# Patient Record
Sex: Female | Born: 1985 | Race: White | Hispanic: No | Marital: Married | Smoking: Never smoker
Health system: Southern US, Community
[De-identification: ages and names within clinical notes are randomized; demographics above are authoritative.]

## PROBLEM LIST (undated history)

## (undated) DIAGNOSIS — K8050 Calculus of bile duct without cholangitis or cholecystitis without obstruction: Principal | ICD-10-CM

## (undated) DIAGNOSIS — Z9889 Other specified postprocedural states: Secondary | ICD-10-CM

## (undated) DIAGNOSIS — G43909 Migraine, unspecified, not intractable, without status migrainosus: Secondary | ICD-10-CM

## (undated) HISTORY — DX: Migraine, unspecified, not intractable, without status migrainosus: G43.909

---

## 2006-09-21 ENCOUNTER — Emergency Department: Payer: Self-pay | Admitting: Emergency Medicine

## 2007-10-02 ENCOUNTER — Ambulatory Visit: Payer: Self-pay | Admitting: Internal Medicine

## 2009-11-15 ENCOUNTER — Observation Stay: Payer: Self-pay | Admitting: Obstetrics and Gynecology

## 2009-11-22 ENCOUNTER — Inpatient Hospital Stay: Payer: Self-pay

## 2011-07-09 ENCOUNTER — Ambulatory Visit: Payer: Self-pay | Admitting: Obstetrics and Gynecology

## 2011-07-10 ENCOUNTER — Inpatient Hospital Stay: Payer: Self-pay | Admitting: Obstetrics and Gynecology

## 2012-04-04 ENCOUNTER — Encounter: Payer: Self-pay | Admitting: Maternal & Fetal Medicine

## 2012-07-12 ENCOUNTER — Observation Stay: Payer: Self-pay

## 2012-07-12 LAB — CBC WITH DIFFERENTIAL/PLATELET
Basophil %: 0.5 %
Eosinophil #: 0.1 10*3/uL (ref 0.0–0.7)
Eosinophil %: 0.8 %
HGB: 13.1 g/dL (ref 12.0–16.0)
MCH: 32.2 pg (ref 26.0–34.0)
MCV: 92 fL (ref 80–100)
Monocyte #: 0.4 x10 3/mm (ref 0.2–0.9)
Monocyte %: 3.5 %
Neutrophil #: 8.8 10*3/uL — ABNORMAL HIGH (ref 1.4–6.5)
Neutrophil %: 79.8 %
Platelet: 248 10*3/uL (ref 150–440)
RBC: 4.08 10*6/uL (ref 3.80–5.20)
RDW: 13.4 % (ref 11.5–14.5)
WBC: 11 10*3/uL (ref 3.6–11.0)

## 2012-07-13 ENCOUNTER — Ambulatory Visit: Payer: Self-pay

## 2012-07-13 HISTORY — PX: TUBAL LIGATION: SHX77

## 2012-07-23 ENCOUNTER — Observation Stay: Payer: Self-pay

## 2012-09-13 ENCOUNTER — Observation Stay: Payer: Self-pay | Admitting: Obstetrics and Gynecology

## 2012-09-30 ENCOUNTER — Observation Stay: Payer: Self-pay | Admitting: Obstetrics and Gynecology

## 2012-10-07 ENCOUNTER — Ambulatory Visit: Payer: Self-pay | Admitting: Obstetrics and Gynecology

## 2012-10-07 LAB — CBC WITH DIFFERENTIAL/PLATELET
Basophil #: 0 10*3/uL (ref 0.0–0.1)
HCT: 37.3 % (ref 35.0–47.0)
HGB: 12.7 g/dL (ref 12.0–16.0)
Lymphocyte %: 13.4 %
MCH: 30.6 pg (ref 26.0–34.0)
MCHC: 34.2 g/dL (ref 32.0–36.0)
MCV: 90 fL (ref 80–100)
Monocyte #: 0.3 x10 3/mm (ref 0.2–0.9)
Neutrophil #: 7.3 10*3/uL — ABNORMAL HIGH (ref 1.4–6.5)
Platelet: 213 10*3/uL (ref 150–440)
RDW: 13.8 % (ref 11.5–14.5)
WBC: 8.9 10*3/uL (ref 3.6–11.0)

## 2012-10-10 ENCOUNTER — Inpatient Hospital Stay: Payer: Self-pay | Admitting: Obstetrics and Gynecology

## 2014-07-09 ENCOUNTER — Ambulatory Visit: Payer: Self-pay | Admitting: Physician Assistant

## 2014-11-02 NOTE — Discharge Summary (Signed)
PATIENT NAME:  Rhonda Gillespie, Rhonda Gillespie MR#:  [id number] DATE OF BIRTH:  Nov 22, 1985  DATE OF ADMISSION:  10/10/2012 DATE OF DISCHARGE:  10/13/2012  ADMISSION DIAGNOSES: 1.  Term gestation.  2.  History of cesarean section.  3.  Desired repeat cesarean section.  4.  Desired tubal ligation.   DISCHARGE DIAGNOSES: 1.  Term gestation.  2.  History of cesarean section.  3.  Desired repeat cesarean section.  4.  Desired tubal ligation.   PROCEDURE:  Repeat cesarean section and tubal ligation. Please see operative note.   HOSPITAL COURSE: The patient did well postoperatively. She was afebrile and tolerating diet from day 1. She was discharged home with routine prescriptions, precautions and followup.  ____________________________ Reatha Harpsicky Gillespie. Logan BoresEvans, MD rle:sb D: 10/18/2012 09:20:30 ET T: 10/18/2012 09:43:01 ET JOB#: [id number]  cc: Ricky Gillespie. Logan BoresEvans, MD, <Dictator> Augustina MoodICK Gillespie Sartaj Hoskin MD ELECTRONICALLY SIGNED 10/24/2012 11:34

## 2014-11-02 NOTE — Op Note (Signed)
PATIENT NAME:  Rhonda Gillespie, [name] MR#:  [id number] DATE OF BIRTH:  Apr 26, 1986  DATE OF PROCEDURE:  10/10/2012  PREOPERATIVE DIAGNOSES:  1. Term gestation.  2. History of previous cesarean section.  3. Desires repeat. 4. Desires tubal ligation.   POSTOPERATIVE DIAGNOSES:  1. Term gestation.  2. History of previous cesarean section.  3. Desires repeat. 4. Desires tubal ligation.   PROCEDURE: Repeat low transverse cesarean section and bilateral tubal ligation.   SURGEON: Ricky L. Logan BoresEvans, MD   ASSISTANT: Milon Scorearon Jones, certified nurse midwife.  ANESTHESIA: Spinal by Dr. Darleene CleaverVan Staveren.   FINDINGS: Postsurgical abdomen, uterus adherent left fallopian tube, vigorous infant female, Apgars 9 and 9.   ESTIMATED BLOOD LOSS: 750.   COMPLICATIONS: None.   SPECIMENS: Left fallopian tube fimbria and portion of right fallopian tube ampullary segment.   PROCEDURE IN DETAIL: The patient was consented. Preoperative antibiotics were given. Taken to the operating room and placed in sitting position where spinal was placed then placed in supine position. Prepped and draped in the usual sterile fashion. Foley catheter inserted. After assuring adequate anesthesia, a #10 blade was used to create a Pfannenstiel incision through old scar. Dense scar tissue was dissected through. Bladder was dissected off the uterus. A low transverse incision was made. Amniotic sac was bluntly entered and extended, and head of infant was delivered with fundal pressure. Placenta was manually delivered. Uterus was exteriorized. Bladder blade was placed. Uterine cavity curettaged and then closed with a running interlocking 0 chromic from left to right. A moist lap was placed. We turned our attention to the fallopian tubes.   Right fallopian tube was visualized and grasped in a relatively avascular region in the ampullary segment and portion of fallopian tube was removed after double ligation with 0 plain in the usual fashion. Stumps were  cauterized and on inspection, the left fallopian tube showed adherence densely to the left ovary down its course. Fimbria was clear, so fimbria was elevated, clamped with Tresa EndoKelly, excised and double sutured with 0 Vicryl. Stump was made hemostatic.   Uterus returned to the abdominal cavity. Incision sites were seen to be hemostatic x3. The rectus was hemostatic. The fascia was closed left to right with 0 Vicryl after insertion of On-Q pump catheters. Subcu was made hemostatic with cautery and the skin was reapproximated with 0 gut suture x 3 vertical mattress. The skin incision was closed with absorbable sutures and then the On-Q catheters were secured using skin glue, Steri-Strips, and Tegaderm in the usual fashion. Catheters were then bolused with 5 mL each.  The patient tolerated the procedure well. Preoperative antibiotics were given and was 800 of clindamycin. Anticipate a routine postoperative course.     ____________________________ Reatha Harpsicky L. Logan BoresEvans, MD rle:es D: 10/10/2012 08:43:02 ET T: 10/10/2012 09:10:14 ET JOB#: [id number]  cc: Ricky L. Logan BoresEvans, MD, <Dictator> Augustina MoodICK L Refugio Mcconico MD ELECTRONICALLY SIGNED 10/10/2012 12:15

## 2014-11-04 NOTE — Discharge Summary (Signed)
PATIENT NAME:  Rhonda Gillespie, Brigitt L MR#:  [id number] DATE OF BIRTH:  1985/07/30  DATE OF ADMISSION:  07/10/2011 DATE OF DISCHARGE:  07/13/2011  ADMISSION DIAGNOSES:  1. Term gestation.  2. Prior cesarean.  3. Desires repeat cesarean section.   DISCHARGE DIAGNOSES:  1. Term gestation.  2. Prior cesarean.  3. Desires repeat cesarean section.   PROCEDURE: Repeat cesarean.  CONSULTANTS: Anesthesia for surgery.   COMPLICATIONS: None.   HOSPITAL COURSE: The patient did well. Postoperative hematocrit 39.5. She was afebrile throughout and on the morning of day threes she was discharged home with routine prescriptions, precautions, and follow-up.  ____________________________ Reatha Harpsicky L. Logan BoresEvans, MD rle:slb D: 07/20/2011 22:00:33 ET T: 07/21/2011 16:57:54 ET JOB#: [id number]  cc: Ricky L. Logan BoresEvans, MD, <Dictator> Augustina MoodICK L Camille Dragan MD ELECTRONICALLY SIGNED 07/22/2011 9:24

## 2014-11-20 NOTE — H&P (Signed)
L&D Evaluation:  History:   HPI [id number] with LMP of 01/11/12 & EDD of 10/17/12 with PNC at [facility] significant for CC:vag bleeding today at 1100 approx 10 cm's bright red on peri pad. No UC's, decreased FM or any S/S of labor. Pt has hx of marginal previa and has been on pelvic rest. Currently, not having sex. No LOF, or further bleeding seen.    Presents with vaginal bleeding    Patient's Medical History Obesity, Close interconceptual spacing, SIDS hx at 6 mos, APre-ecclampsia    Patient's Surgical History LTCS    Medications Pre Natal Vitamins    Allergies PCN, Amoxicillen, Pitocin causes itching    Social History none    Family History Non-Contributory   ROS:   ROS All systems were reviewed.  HEENT, CNS, GI, GU, Respiratory, CV, Renal and Musculoskeletal systems were found to be normal.   Exam:   Vital Signs stable    General no apparent distress    Mental Status clear    Chest clear    Heart normal sinus rhythm, no murmur/gallop/rubs    Abdomen gravid, non-tender    Estimated Fetal Weight Average for gestational age    Back no CVAT    Edema 1+    Reflexes 1+    Clonus negative    Pelvic not evaluated    Mebranes Intact    FHT normal rate with no decels, + FHR and very active FM    Ucx other    Skin dry    Lymph no lymphadenopathy   Impression:   Impression IUP at 26 weeks with Vag Bleeding   Plan:   Plan Obs for vag bleeding and VS    Comments Pt and family and partner informed of marginal placenta and probably the reason she is bleeding. Will continue to observe. CBC ordered. Will order US.   Electronic Signatures: Sharee PimpleJones, Ashli Selders W (CNM)  (Signed 31-Dec-13 13:01)  Authored: L&D Evaluation   Last Updated: 31-Dec-13 13:01 by Sharee PimpleJones, Thetis Schwimmer W (CNM)

## 2015-06-19 ENCOUNTER — Ambulatory Visit (INDEPENDENT_AMBULATORY_CARE_PROVIDER_SITE_OTHER): Payer: BLUE CROSS/BLUE SHIELD | Admitting: Physician Assistant

## 2015-06-19 ENCOUNTER — Encounter: Payer: Self-pay | Admitting: Physician Assistant

## 2015-06-19 VITALS — BP 138/80 | HR 94 | Temp 98.3°F | Resp 16 | Ht 63.0 in | Wt 235.8 lb

## 2015-06-19 DIAGNOSIS — Z13220 Encounter for screening for lipoid disorders: Secondary | ICD-10-CM

## 2015-06-19 DIAGNOSIS — Z136 Encounter for screening for cardiovascular disorders: Secondary | ICD-10-CM | POA: Diagnosis not present

## 2015-06-19 DIAGNOSIS — Z833 Family history of diabetes mellitus: Secondary | ICD-10-CM

## 2015-06-19 DIAGNOSIS — Z6841 Body Mass Index (BMI) 40.0 and over, adult: Secondary | ICD-10-CM

## 2015-06-19 DIAGNOSIS — Z713 Dietary counseling and surveillance: Secondary | ICD-10-CM

## 2015-06-19 DIAGNOSIS — Z7189 Other specified counseling: Secondary | ICD-10-CM | POA: Diagnosis not present

## 2015-06-19 DIAGNOSIS — Z7689 Persons encountering health services in other specified circumstances: Secondary | ICD-10-CM

## 2015-06-19 MED ORDER — PHENTERMINE HCL 37.5 MG PO CAPS: 37.5000 mg | ORAL_CAPSULE | ORAL | Status: DC

## 2015-06-19 NOTE — Patient Instructions (Signed)
Calorie Counting for Weight Loss Calories are energy you get from the things you eat and drink. Your body uses this energy to keep you going throughout the day. The number of calories you eat affects your weight. When you eat more calories than your body needs, your body stores the extra calories as fat. When you eat fewer calories than your body needs, your body burns fat to get the energy it needs. Calorie counting means keeping track of how many calories you eat and drink each day. If you make sure to eat fewer calories than your body needs, you should lose weight. In order for calorie counting to work, you will need to eat the number of calories that are right for you in a day to lose a healthy amount of weight per week. A healthy amount of weight to lose per week is usually 1-2 lb (0.5-0.9 kg). A dietitian can determine how many calories you need in a day and give you suggestions on how to reach your calorie goal.  WHAT IS MY MY PLAN? My goal is to have 1200-1500 calories per day.  If I have this many calories per day, I should lose around 1-2 pounds per week. WHAT DO I NEED TO KNOW ABOUT CALORIE COUNTING? In order to meet your daily calorie goal, you will need to:  Find out how many calories are in each food you would like to eat. Try to do this before you eat.  Decide how much of the food you can eat.  Write down what you ate and how many calories it had. Doing this is called keeping a food log. WHERE DO I FIND CALORIE INFORMATION? The number of calories in a food can be found on a Nutrition Facts label. Note that all the information on a label is based on a specific serving of the food. If a food does not have a Nutrition Facts label, try to look up the calories online or ask your dietitian for help. HOW DO I DECIDE HOW MUCH TO EAT? To decide how much of the food you can eat, you will need to consider both the number of calories in one serving and the size of one serving. This information  can be found on the Nutrition Facts label. If a food does not have a Nutrition Facts label, look up the information online or ask your dietitian for help. Remember that calories are listed per serving. If you choose to have more than one serving of a food, you will have to multiply the calories per serving by the amount of servings you plan to eat. For example, the label on a package of bread might say that a serving size is 1 slice and that there are 90 calories in a serving. If you eat 1 slice, you will have eaten 90 calories. If you eat 2 slices, you will have eaten 180 calories. HOW DO I KEEP A FOOD LOG? After each meal, record the following information in your food log:  What you ate.  How much of it you ate.  How many calories it had.  Then, add up your calories. Keep your food log near you, such as in a small notebook in your pocket. Another option is to use a mobile app or website. Some programs will calculate calories for you and show you how many calories you have left each time you add an item to the log. WHAT ARE SOME CALORIE COUNTING TIPS?  Use your calories on foods   and drinks that will fill you up and not leave you hungry. Some examples of this include foods like nuts and nut butters, vegetables, lean proteins, and high-fiber foods (more than 5 g fiber per serving).  Eat nutritious foods and avoid empty calories. Empty calories are calories you get from foods or beverages that do not have many nutrients, such as candy and soda. It is better to have a nutritious high-calorie food (such as an avocado) than a food with few nutrients (such as a bag of chips).  Know how many calories are in the foods you eat most often. This way, you do not have to look up how many calories they have each time you eat them.  Look out for foods that may seem like low-calorie foods but are really high-calorie foods, such as baked goods, soda, and fat-free candy.  Pay attention to calories in drinks.  Drinks such as sodas, specialty coffee drinks, alcohol, and juices have a lot of calories yet do not fill you up. Choose low-calorie drinks like water and diet drinks.  Focus your calorie counting efforts on higher calorie items. Logging the calories in a garden salad that contains only vegetables is less important than calculating the calories in a milk shake.  Find a way of tracking calories that works for you. Get creative. Most people who are successful find ways to keep track of how much they eat in a day, even if they do not count every calorie. WHAT ARE SOME PORTION CONTROL TIPS?  Know how many calories are in a serving. This will help you know how many servings of a certain food you can have.  Use a measuring cup to measure serving sizes. This is helpful when you start out. With time, you will be able to estimate serving sizes for some foods.  Take some time to put servings of different foods on your favorite plates, bowls, and cups so you know what a serving looks like.  Try not to eat straight from a bag or box. Doing this can lead to overeating. Put the amount you would like to eat in a cup or on a plate to make sure you are eating the right portion.  Use smaller plates, glasses, and bowls to prevent overeating. This is a quick and easy way to practice portion control. If your plate is smaller, less food can fit on it.  Try not to multitask while eating, such as watching TV or using your computer. If it is time to eat, sit down at a table and enjoy your food. Doing this will help you to start recognizing when you are full. It will also make you more aware of what and how much you are eating. HOW CAN I CALORIE COUNT WHEN EATING OUT?  Ask for smaller portion sizes or child-sized portions.  Consider sharing an entree and sides instead of getting your own entree.  If you get your own entree, eat only half. Ask for a box at the beginning of your meal and put the rest of your entree in  it so you are not tempted to eat it.  Look for the calories on the menu. If calories are listed, choose the lower calorie options.  Choose dishes that include vegetables, fruits, whole grains, low-fat dairy products, and lean protein. Focusing on smart food choices from each of the 5 food groups can help you stay on track at restaurants.  Choose items that are boiled, broiled, grilled, or steamed.  Choose   water, milk, unsweetened iced tea, or other drinks without added sugars. If you want an alcoholic beverage, choose a lower calorie option. For example, a regular margarita can have up to 700 calories and a glass of wine has around 150.  Stay away from items that are buttered, battered, fried, or served with cream sauce. Items labeled "crispy" are usually fried, unless stated otherwise.  Ask for dressings, sauces, and syrups on the side. These are usually very high in calories, so do not eat much of them.  Watch out for salads. Many people think salads are a healthy option, but this is often not the case. Many salads come with bacon, fried chicken, lots of cheese, fried chips, and dressing. All of these items have a lot of calories. If you want a salad, choose a garden salad and ask for grilled meats or steak. Ask for the dressing on the side, or ask for olive oil and vinegar or lemon to use as dressing.  Estimate how many servings of a food you are given. For example, a serving of cooked rice is  cup or about the size of half a tennis ball or one cupcake wrapper. Knowing serving sizes will help you be aware of how much food you are eating at restaurants. The list below tells you how big or small some common portion sizes are based on everyday objects.  1 oz--4 stacked dice.  3 oz--1 deck of cards.  1 tsp--1 dice.  1 Tbsp-- a Ping-Pong ball.  2 Tbsp--1 Ping-Pong ball.   cup--1 tennis ball or 1 cupcake wrapper.  1 cup--1 baseball.   This information is not intended to replace advice  given to you by your health care provider. Make sure you discuss any questions you have with your health care provider.   Document Released: 06/29/2005 Document Revised: 07/20/2014 Document Reviewed: 05/04/2013 Elsevier Interactive Patient Education 2016 [employer] Exercising to Lose Weight Exercising can help you to lose weight. In order to lose weight through exercise, you need to do vigorous-intensity exercise. You can tell that you are exercising with vigorous intensity if you are breathing very hard and fast and cannot hold a conversation while exercising. Moderate-intensity exercise helps to maintain your current weight. You can tell that you are exercising at a moderate level if you have a higher heart rate and faster breathing, but you are still able to hold a conversation. HOW OFTEN SHOULD I EXERCISE? Choose an activity that you enjoy and set realistic goals. Your health care provider can help you to make an activity plan that works for you. Exercise regularly as directed by your health care provider. This may include:  Doing resistance training twice each week, such as:  Push-ups.  Sit-ups.  Lifting weights.  Using resistance bands.  Doing a given intensity of exercise for a given amount of time. Choose from these options:  150 minutes of moderate-intensity exercise every week.  75 minutes of vigorous-intensity exercise every week.  A mix of moderate-intensity and vigorous-intensity exercise every week. Children, pregnant women, people who are out of shape, people who are overweight, and older adults may need to consult a health care provider for individual recommendations. If you have any sort of medical condition, be sure to consult your health care provider before starting a new exercise program. WHAT ARE SOME ACTIVITIES THAT CAN HELP ME TO LOSE WEIGHT?   Walking at a rate of at least 4.5 miles an hour.  Jogging or running at a rate of   5 miles per hour.  Biking at a  rate of at least 10 miles per hour.  Lap swimming.  Roller-skating or in-line skating.  Cross-country skiing.  Vigorous competitive sports, such as football, basketball, and soccer.  Jumping rope.  Aerobic dancing. HOW CAN I BE MORE ACTIVE IN MY DAY-TO-DAY ACTIVITIES?  Use the stairs instead of the elevator.  Take a walk during your lunch break.  If you drive, park your car farther away from work or school.  If you take public transportation, get off one stop early and walk the rest of the way.  Make all of your phone calls while standing up and walking around.  Get up, stretch, and walk around every 30 minutes throughout the day. WHAT GUIDELINES SHOULD I FOLLOW WHILE EXERCISING?  Do not exercise so much that you hurt yourself, feel dizzy, or get very short of breath.  Consult your health care provider prior to starting a new exercise program.  Wear comfortable clothes and shoes with good support.  Drink plenty of water while you exercise to prevent dehydration or heat stroke. Body water is lost during exercise and must be replaced.  Work out until you breathe faster and your heart beats faster.   This information is not intended to replace advice given to you by your health care provider. Make sure you discuss any questions you have with your health care provider.   Document Released: 08/01/2010 Document Revised: 07/20/2014 Document Reviewed: 11/30/2013 Elsevier Interactive Patient Education 2016 [employer]  

## 2015-06-19 NOTE — Progress Notes (Signed)
Patient: Rhonda Gillespie, Female    DOB: 02/29/1986, 29 y.o.   MRN: [id number] Visit Date: 06/19/2015  Today's Provider: Margaretann LovelessJennifer M Burnette, PA-C   Chief Complaint  Patient presents with  . Establish Care   Subjective:    Annual physical exam Rhonda Dachshley L Toner is a 29 y.o. female who presents today for health maintenance and complete physical. She feels well. She reports not exercising. She reports she is sleeping fairly well. Patient wants to talk about losing weight. Patient Declined Influenza vaccine. Patient [facility] her [facility]  Last pap:2015 Obesity: Patient complains of obesity. Patient cites health as reasons for wanting to lose weight.  Obesity History Weight in late teens: 160 lb. Period of greatest weight gain: 235.8 lb during early adult years 29 year old Lowest adult weight: 157-160 lbs Highest adult weight:235.8 lbs Amount of time at present weight: 235 lb for two years.   History of Weight Loss Efforts Greatest amount of weight lost: 35 lb over 6 months Amount of time that loss was maintained: 4-5 years. Circumstances associated with regain of weight:after the third child. Successful weight loss techniques attempted: prescription appetite suppressants: Phentermine Unsuccessful weight loss techniques attempted: self-directed dieting  And exercise programs. Current Exercise Habits none  Current Eating Habits Number of regular meals per day: 3 Number of snacking episodes per day: none Who shops for food? patient Who prepares food? patient and husband Who eats with patient? patient and family Binge behavior?: no Purge behavior? no Anorexic behavior? no Eating precipitated by stress? Yes. Guilt feelings associated with eating? sometimes  Other Potential Contributing Factors Use of alcohol: average 0 drinks/week Use of medications that may cause weight gain none History of past abuse? none   Review of Systems    Constitutional: Negative.   HENT: Negative.   Eyes: Negative.   Respiratory: Negative.   Cardiovascular: Negative.   Gastrointestinal: Negative.   Endocrine: Negative.   Genitourinary: Negative.   Musculoskeletal: Negative.   Skin: Negative.   Allergic/Immunologic: Negative.   Neurological: Negative.   Hematological: Negative.   Psychiatric/Behavioral: Negative.     Social History      She  reports that she has never smoked. She has never used smokeless tobacco. She reports that she does not drink alcohol or use illicit drugs.       Social History   Social History  . Marital Status: Married    Spouse Name: N/A  . Number of Children: N/A  . Years of Education: N/A   Social History Main Topics  . Smoking status: Never Smoker   . Smokeless tobacco: Never Used  . Alcohol Use: No  . Drug Use: No  . Sexual Activity: Not Asked   Other Topics Concern  . None   Social History Narrative  . None    There are no active problems to display for this patient.   Past Surgical History  Procedure Laterality Date  . Cesarean section  2011,2012,2014    x3  . Tubal ligation  2014    Family History        Family Status  Relation Status Death Age  . Mother Alive   . Father Alive   . Maternal Grandmother Alive   . Maternal Grandfather Alive   . Paternal Grandmother Deceased   . Paternal Grandfather Deceased         Her family history includes Cancer in her paternal grandfather; Depression in  her father, maternal grandfather, and mother; Diabetes in her father and maternal grandmother; Heart disease in her maternal grandmother.    Allergies  Allergen Reactions  . Amoxicillin Hives    Previous Medications   No medications on file    Patient Care Team: Margaretann Loveless, PA-C as PCP - General (Family Medicine)     Objective:   Vitals: BP 138/80 mmHg  Pulse 94  Temp(Src) 98.3 F (36.8 C) (Oral)  Resp 16  Ht  (1.6 m)  Wt 235 lb 12.8 oz (106.958 kg)   BMI 41.78 kg/m2  SpO2 96%  LMP 06/10/2015   Physical Exam  Constitutional: She is oriented to person, place, and time. She appears well-developed and well-nourished. No distress.  HENT:  Head: Normocephalic and atraumatic.  Right Ear: External ear normal.  Left Ear: External ear normal.  Nose: Nose normal.  Mouth/Throat: Oropharynx is clear and moist. No oropharyngeal exudate.  Eyes: Conjunctivae and EOM are normal. Pupils are equal, round, and reactive to light. Right eye exhibits no discharge. Left eye exhibits no discharge. No scleral icterus.  Neck: Normal range of motion. Neck supple. No tracheal deviation present. No thyromegaly present.  Cardiovascular: Normal rate, regular rhythm, normal heart sounds and intact distal pulses.  Exam reveals no gallop and no friction rub.   No murmur heard. Pulmonary/Chest: Effort normal and breath sounds normal. No respiratory distress. She has no wheezes. She has no rales. She exhibits no tenderness.  Abdominal: Soft. Bowel sounds are normal. She exhibits no distension and no mass. There is no tenderness. There is no rebound and no guarding.  Musculoskeletal: Normal range of motion. She exhibits no edema or tenderness.  Lymphadenopathy:    She has no cervical adenopathy.  Neurological: She is alert and oriented to person, place, and time.  Skin: Skin is warm and dry. No rash noted. She is not diaphoretic.  Psychiatric: She has a normal mood and affect. Her behavior is normal. Judgment and thought content normal.  Vitals reviewed.    Depression Screen No flowsheet data found.    Assessment & Plan:     Routine Health Maintenance and Physical Exam  1. Establishing care with new doctor, encounter for No previous primary care physician.  2. Encounter for weight loss counseling She is very motivated to lose weight at this time. She states that she has previously used phentermine when she was in her early 31s successfully. When she got down  to her goal weight of 160 she discontinued use. She was able to maintain that weight until she became pregnant with her first child. She states she had her 3 children pretty consecutively and never had a chance to lose baby weight between each pregnancy. Since the birth of her last child at 1 she has maintained a weight of 235. We discussed also incorporating a food diary so that she may be able to monitor her intake and portion size. I did advise her to stay around 1200 to no more than 1500 cal per day. I also instructed her to make sure to not eat any less than 1000 cal per day. She is also going to be doing the 21 day fix exercise program at home. She does have a friend that we'll be doing this with her. I will also prescribe phentermine again as below for appetite suppression. I will see her back in 4 weeks for a weight recheck to evaluate how she is doing with this exercise and diet plan. -  phentermine 37.5 MG capsule; Take 1 capsule (37.5 mg total) by mouth every morning.  Dispense: 30 capsule; Refill: 0  3. BMI 40.0-44.9, adult [facility]) See above medical treatment plan. - phentermine 37.5 MG capsule; Take 1 capsule (37.5 mg total) by mouth every morning.  Dispense: 30 capsule; Refill: 0  4. Morbid obesity due to excess calories [facility]) See above medical treatment plan. I will also check labs as below to evaluate for any secondary cause for why she may not be able to lose weight. I will follow-up with her pending lab results. If labs are stable and within normal limits I will see her back in 4 weeks for her weight recheck. - CBC with Differential/Platelet - Comprehensive metabolic panel - TSH - phentermine 37.5 MG capsule; Take 1 capsule (37.5 mg total) by mouth every morning.  Dispense: 30 capsule; Refill: 0  5. Encounter for lipid screening for cardiovascular disease She has never had her cholesterol checked. I will check her cholesterol as below. I will follow-up with her pending results. If  cholesterol is stable and within normal limits she would not need her cholesterol checked for 5 years. If cholesterol is elevated we will recheck in one year with lifestyle changes. - Lipid panel  6. Family history of diabetes mellitus (DM) She does have a strong family history of diabetes. She states that she has diabetes history in her mother and grandmother. She has never had her hemoglobin A1c checked. We will check this for her today and follow-up pending results. If hemoglobin A1c is within normal limits we will recheck again in one year. - Hemoglobin A1c   Exercise Activities and Dietary recommendations Goals    None       There is no immunization history on file for this patient.  Health Maintenance  Topic Date Due  . HIV Screening  05/20/1999  . TETANUS/TDAP  05/20/2003  . PAP SMEAR  05/19/2005  . INFLUENZA VACCINE  02/11/2015      Discussed health benefits of physical activity, and encouraged her to engage in regular exercise appropriate for her age and condition.    --------------------------------------------------------------------

## 2015-06-20 ENCOUNTER — Encounter: Payer: Self-pay | Admitting: Physician Assistant

## 2015-07-17 ENCOUNTER — Ambulatory Visit: Payer: BLUE CROSS/BLUE SHIELD | Admitting: Physician Assistant

## 2015-11-14 ENCOUNTER — Encounter: Payer: Self-pay | Admitting: Physician Assistant

## 2015-11-14 ENCOUNTER — Ambulatory Visit (INDEPENDENT_AMBULATORY_CARE_PROVIDER_SITE_OTHER): Payer: BLUE CROSS/BLUE SHIELD | Admitting: Physician Assistant

## 2015-11-14 VITALS — BP 130/80 | HR 88 | Temp 98.3°F | Resp 16 | Ht 63.0 in | Wt 235.0 lb

## 2015-11-14 DIAGNOSIS — Z6841 Body Mass Index (BMI) 40.0 and over, adult: Secondary | ICD-10-CM

## 2015-11-14 DIAGNOSIS — Z713 Dietary counseling and surveillance: Secondary | ICD-10-CM | POA: Diagnosis not present

## 2015-11-14 MED ORDER — PHENTERMINE HCL 37.5 MG PO CAPS: 37.5000 mg | ORAL_CAPSULE | ORAL | Status: DC

## 2015-11-14 NOTE — Patient Instructions (Signed)

## 2015-11-14 NOTE — Progress Notes (Signed)
Patient: Rhonda Gillespie Female    DOB: 05/20/1986   30 y.o.   MRN: [id number] Visit Date: 11/14/2015  Today's Provider: Margaretann LovelessJennifer M Kellin Fifer, PA-C   Chief Complaint  Patient presents with  . Follow-up    Weight   Subjective:    HPI Patient is here for follow-up on weight counseling. Last office visit patient was prescribed Phentermine 37.5 MG to help with her appetite. Lipid screening were also ordered for Cardiovascular disease and a Hemoglobin A1C for family history of Diabetes Mellitus. She was given a lab slip in December at establishing care visit but did not get labs done.   She reports she is trying to eat healthy by counting calories.She is using her my fitness pal occasionally, not regular. She is not taking the phentermine and she wants to go back on it. When she tried before it was at the holidays and she was not consistent with taking it.   She is very committed to losing weight and has good plans in place. She sees future health issues and self image as her reasons for wanting to make lifestyle changes and lose weight.   Current Exercise Habits: The patient does not participate in regular exercise at present      Allergies  Allergen Reactions  . Amoxicillin Hives   Previous Medications   PHENTERMINE 37.5 MG CAPSULE    Take 1 capsule (37.5 mg total) by mouth every morning.    Review of Systems  Constitutional: Negative.   HENT: Negative.   Respiratory: Negative.   Cardiovascular: Negative for chest pain, palpitations and leg swelling.  Gastrointestinal: Negative for nausea, vomiting and abdominal pain.  Musculoskeletal: Negative.   Psychiatric/Behavioral: Negative.     Social History  Substance Use Topics  . Smoking status: Never Smoker   . Smokeless tobacco: Never Used  . Alcohol Use: No   Objective:   BP 130/80 mmHg  Pulse 88  Temp(Src) 98.3 F (36.8 C) (Oral)  Resp 16  Ht 5\' 3"  (1.6 m)  Wt 235 lb (106.595 kg)  BMI 41.64 kg/m2  LMP  10/31/2015  Physical Exam  Constitutional: She appears well-developed and well-nourished. No distress.  Neck: Normal range of motion. Neck supple.  Cardiovascular: Normal rate, regular rhythm and normal heart sounds.  Exam reveals no gallop and no friction rub.   No murmur heard. Pulmonary/Chest: Effort normal and breath sounds normal. No respiratory distress. She has no wheezes. She has no rales.  Skin: She is not diaphoretic.  Vitals reviewed.  Goals    . Exercise 150 minutes per week (moderate activity)    . Increase water intake    . Reduce calorie intake to 1200-1400 calories per day         Assessment & Plan:     1. Encounter for weight loss counseling Discussed being more diligent with food diary. Advised to stay between 1200-1400 calorie diet. Discussed different ways to add exercise into a daily schedule. Advised to try to exercise 150 min per week. Will add phentermine for appetite suppression. I will see her back in 4 weeks for weight recheck. - phentermine 37.5 MG capsule; Take 1 capsule (37.5 mg total) by mouth every morning.  Dispense: 30 capsule; Refill: 0  2. BMI 40.0-44.9, adult [facility](HCC) See above medical treatment plan. - phentermine 37.5 MG capsule; Take 1 capsule (37.5 mg total) by mouth every morning.  Dispense: 30 capsule; Refill: 0  3. Morbid obesity due to excess calories (  HCC) See above medical treatment plan. - phentermine 37.5 MG capsule; Take 1 capsule (37.5 mg total) by mouth every morning.  Dispense: 30 capsule; Refill: 0       Margaretann Loveless, PA-C  [facility]

## 2015-12-12 ENCOUNTER — Ambulatory Visit (INDEPENDENT_AMBULATORY_CARE_PROVIDER_SITE_OTHER): Payer: BLUE CROSS/BLUE SHIELD | Admitting: Physician Assistant

## 2015-12-12 ENCOUNTER — Encounter: Payer: Self-pay | Admitting: Physician Assistant

## 2015-12-12 VITALS — BP 118/88 | HR 100 | Temp 98.6°F | Resp 16 | Ht 64.0 in | Wt 232.0 lb

## 2015-12-12 DIAGNOSIS — Z6839 Body mass index (BMI) 39.0-39.9, adult: Secondary | ICD-10-CM | POA: Diagnosis not present

## 2015-12-12 DIAGNOSIS — Z713 Dietary counseling and surveillance: Secondary | ICD-10-CM

## 2015-12-12 MED ORDER — PHENTERMINE HCL 37.5 MG PO CAPS: 37.5000 mg | ORAL_CAPSULE | ORAL | Status: DC

## 2015-12-12 NOTE — Patient Instructions (Signed)

## 2015-12-12 NOTE — Progress Notes (Signed)
Patient ID: Rhonda Gillespie, female   DOB: 07/22/1985, 30 y.o.   MRN: [id number]       Patient: Rhonda Gillespie Female    DOB: 07/20/1985   30 y.o.   MRN: [id number] Visit Date: 12/12/2015  Today's Provider: Margaretann LovelessJennifer M Lakechia Nay, PA-C   Chief Complaint  Patient presents with  . Follow-up    weight   Subjective:    HPI  Follow up for weight loss  The patient was last seen for this 4 weeks ago. Changes made at last visit include starting Phentermine 37.5 mg daily.  She reports excellent compliance with treatment. She feels that condition is Unchanged. She is not having side effects.  Patient reports that she started medication 2 weeks ago due to problems getting filled due to insurance. Patient reports that she has been going to the gym 2-3 days a week works out for at least 30 minutes. She is not doing a food diary at this time, but states she is trying to keep up with it in her head as she eats the same thing every day.   She has went from 235 pounds to 232 pounds in 2 weeks.  ------------------------------------------------------------------------------------     Allergies  Allergen Reactions  . Amoxicillin Hives   Previous Medications   PHENTERMINE 37.5 MG CAPSULE    Take 1 capsule (37.5 mg total) by mouth every morning.    Review of Systems  Constitutional: Negative.   Respiratory: Negative.   Cardiovascular: Negative.   Gastrointestinal: Negative.   Endocrine: Negative.   Psychiatric/Behavioral: Negative.     Social History  Substance Use Topics  . Smoking status: Never Smoker   . Smokeless tobacco: Never Used  . Alcohol Use: No   Objective:   BP 118/88 mmHg  Pulse 100  Temp(Src) 98.6 F (37 C) (Oral)  Resp 16  Ht 5\' 4"  (1.626 m)  Wt 232 lb (105.235 kg)  BMI 39.80 kg/m2  LMP 11/21/2015 (Approximate)  Physical Exam  Constitutional: She appears well-developed and well-nourished. No distress.  Neck: Normal range of motion. Neck supple.  Cardiovascular:  Normal rate, regular rhythm and normal heart sounds.  Exam reveals no gallop and no friction rub.   No murmur heard. Pulmonary/Chest: Effort normal and breath sounds normal. No respiratory distress. She has no wheezes. She has no rales.  Skin: She is not diaphoretic.  Psychiatric: She has a normal mood and affect. Her behavior is normal. Judgment and thought content normal.  Vitals reviewed.       Assessment & Plan:     1. Encounter for weight loss counseling . He pounds over the last 2 weeks with using the phentermine. She is going to continue with her exercise routine. I didn't challenge her to try to add 5 minutes here therefore exercise. She agrees. We also discussed the importance of doing a food diary to make sure that 1) she is eating enough calories and 2) that she does not overeat as her metabolism starts to increase. She voiced understanding with this. I will refill the phentermine as below for appetite suppression. I will see her back in 6 weeks for her weight recheck. - phentermine 37.5 MG capsule; Take 1 capsule (37.5 mg total) by mouth every morning.  Dispense: 30 capsule; Refill: 0  2. Morbid obesity due to excess calories [facility](HCC) See above medical treatment plan. - phentermine 37.5 MG capsule; Take 1 capsule (37.5 mg total) by mouth every morning.  Dispense: 30 capsule; Refill: 0  3. BMI 39.0-39.9,adult See above medical treatment plan.       Margaretann Loveless, PA-C  [facility]

## 2016-01-16 ENCOUNTER — Encounter: Payer: Self-pay | Admitting: Physician Assistant

## 2016-01-16 ENCOUNTER — Ambulatory Visit (INDEPENDENT_AMBULATORY_CARE_PROVIDER_SITE_OTHER): Payer: BLUE CROSS/BLUE SHIELD | Admitting: Physician Assistant

## 2016-01-16 VITALS — BP 118/70 | HR 90 | Temp 98.1°F | Resp 16 | Wt 230.4 lb

## 2016-01-16 DIAGNOSIS — Z6839 Body mass index (BMI) 39.0-39.9, adult: Secondary | ICD-10-CM | POA: Diagnosis not present

## 2016-01-16 DIAGNOSIS — E669 Obesity, unspecified: Secondary | ICD-10-CM | POA: Insufficient documentation

## 2016-01-16 DIAGNOSIS — Z713 Dietary counseling and surveillance: Secondary | ICD-10-CM

## 2016-01-16 MED ORDER — PHENTERMINE HCL 37.5 MG PO CAPS: 37.5000 mg | ORAL_CAPSULE | ORAL | Status: DC

## 2016-01-16 NOTE — Progress Notes (Signed)
       Patient: Rhonda Gillespie Female    DOB: 03/26/1986   30 y.o.   MRN: [id number] Visit Date: 01/16/2016  Today's Provider: Margaretann LovelessJennifer M Burnette, PA-C   Chief Complaint  Patient presents with  . Follow-up    weight loss counseling   Subjective:     HPI Patient is here to follow-up on weight. She is on Phentermine 37.5 MG.She reports eating much healthier. She is following a meal plan.   Home Exercise  01/16/2016 11/14/2015  Current Exercise Habits Structured exercise class The patient does not participate in regular exercise at present  Type of exercise treadmill -  Time (Minutes) 30 -  Frequency (Times/Week) 3 -  Weekly Exercise (Minutes/Week) 90 -  Intensity Intense -  Exercise limited by: None identified -    Wt Readings from Last 3 Encounters:  01/16/16 230 lb 6.4 oz (104.509 kg)  12/12/15 232 lb (105.235 kg)  11/14/15 235 lb (106.595 kg)      Allergies  Allergen Reactions  . Amoxicillin Hives   Current Meds  Medication Sig  . phentermine 37.5 MG capsule Take 1 capsule (37.5 mg total) by mouth every morning.    Review of Systems  Constitutional: Negative.   Respiratory: Negative.   Cardiovascular: Negative.   Gastrointestinal: Negative.   Psychiatric/Behavioral: Negative.     Social History  Substance Use Topics  . Smoking status: Never Smoker   . Smokeless tobacco: Never Used  . Alcohol Use: No   Objective:   BP 118/70 mmHg  Pulse 90  Temp(Src) 98.1 F (36.7 C) (Oral)  Resp 16  Wt 230 lb 6.4 oz (104.509 kg)  LMP 01/11/2016  Physical Exam  Constitutional: She appears well-developed and well-nourished. No distress.  Neck: Normal range of motion. Neck supple.  Cardiovascular: Normal rate, regular rhythm and normal heart sounds.  Exam reveals no gallop and no friction rub.   No murmur heard. Pulmonary/Chest: Effort normal and breath sounds normal. No respiratory distress. She has no wheezes. She has no rales.  Skin: She is not diaphoretic.    Psychiatric: She has a normal mood and affect. Her behavior is normal. Judgment and thought content normal.  Vitals reviewed.     Assessment & Plan:     1. Encounter for weight loss counseling She has added more physical activity and trying to exercise 3 days per week. She has noticed her hunger increasing slightly on the days she exercises. Advised to make sure she stays strict with meal planning. Will continue phentermine as below for appetite suppression. I will see her back in 4 weeks for a weight check. - phentermine 37.5 MG capsule; Take 1 capsule (37.5 mg total) by mouth every morning.  Dispense: 30 capsule; Refill: 0  2. Obese See above medical treatment plan.  3. BMI 39.0-39.9,adult See above medical treatment plan.       Margaretann LovelessJennifer M Burnette, PA-C  [facility]

## 2016-01-16 NOTE — Patient Instructions (Signed)
Why follow it? Research shows. . Those who follow the Mediterranean diet have a reduced risk of heart disease  . The diet is associated with a reduced incidence of Parkinson's and Alzheimer's diseases . People following the diet may have longer life expectancies and lower rates of chronic diseases  . The Dietary Guidelines for Americans recommends the Mediterranean diet as an eating plan to promote health and prevent disease  What Is the Mediterranean Diet?  . Healthy eating plan based on typical foods and recipes of Mediterranean-style cooking . The diet is primarily a plant based diet; these foods should make up a majority of meals   Starches - Plant based foods should make up a majority of meals - They are an important sources of vitamins, minerals, energy, antioxidants, and fiber - Choose whole grains, foods high in fiber and minimally processed items  - Typical grain sources include wheat, oats, barley, corn, Travieso rice, bulgar, farro, millet, polenta, couscous  - Various types of beans include chickpeas, lentils, fava beans, black beans, white beans   Fruits  Veggies - Large quantities of antioxidant rich fruits & veggies; 6 or more servings  - Vegetables can be eaten raw or lightly drizzled with oil and cooked  - Vegetables common to the traditional Mediterranean Diet include: artichokes, arugula, beets, broccoli, brussel sprouts, cabbage, carrots, celery, collard greens, cucumbers, eggplant, kale, leeks, lemons, lettuce, mushrooms, okra, onions, peas, peppers, potatoes, pumpkin, radishes, rutabaga, shallots, spinach, sweet potatoes, turnips, zucchini - Fruits common to the Mediterranean Diet include: apples, apricots, avocados, cherries, clementines, dates, figs, grapefruits, grapes, melons, nectarines, oranges, peaches, pears, pomegranates, strawberries, tangerines  Fats - Replace butter and margarine with healthy oils, such as olive oil, canola oil, and tahini  - Limit nuts to no  more than a handful a day  - Nuts include walnuts, almonds, pecans, pistachios, pine nuts  - Limit or avoid candied, honey roasted or heavily salted nuts - Olives are central to the Mediterranean diet - can be eaten whole or used in a variety of dishes   Meats Protein - Limiting red meat: no more than a few times a month - When eating red meat: choose lean cuts and keep the portion to the size of deck of cards - Eggs: approx. 0 to 4 times a week  - Fish and lean poultry: at least 2 a week  - Healthy protein sources include, chicken, turkey, lean beef, lamb - Increase intake of seafood such as tuna, salmon, trout, mackerel, shrimp, scallops - Avoid or limit high fat processed meats such as sausage and bacon  Dairy - Include moderate amounts of low fat dairy products  - Focus on healthy dairy such as fat free yogurt, skim milk, low or reduced fat cheese - Limit dairy products higher in fat such as whole or 2% milk, cheese, ice cream  Alcohol - Moderate amounts of red wine is ok  - No more than 5 oz daily for women (all ages) and men older than age 65  - No more than 10 oz of wine daily for men younger than 65  Other - Limit sweets and other desserts  - Use herbs and spices instead of salt to flavor foods  - Herbs and spices common to the traditional Mediterranean Diet include: basil, bay leaves, chives, cloves, cumin, fennel, garlic, lavender, marjoram, mint, oregano, parsley, pepper, rosemary, sage, savory, sumac, tarragon, thyme   It's not just a diet, it's a lifestyle:  . The Mediterranean diet includes   lifestyle factors typical of those in the region  . Foods, drinks and meals are best eaten with others and savored . Daily physical activity is important for overall good health . This could be strenuous exercise like running and aerobics . This could also be more leisurely activities such as walking, housework, yard-work, or taking the stairs . Moderation is the key; a balanced and  healthy diet accommodates most foods and drinks . Consider portion sizes and frequency of consumption of certain foods   Meal Ideas & Options:  . Breakfast:  o Whole wheat toast or whole wheat English muffins with peanut butter & hard boiled egg o Steel cut oats topped with apples & cinnamon and skim milk  o Fresh fruit: banana, strawberries, melon, berries, peaches  o Smoothies: strawberries, bananas, greek yogurt, peanut butter o Low fat greek yogurt with blueberries and granola  o Egg white omelet with spinach and mushrooms o Breakfast couscous: whole wheat couscous, apricots, skim milk, cranberries  . Sandwiches:  o Hummus and grilled vegetables (peppers, zucchini, squash) on whole wheat bread   o Grilled chicken on whole wheat pita with lettuce, tomatoes, cucumbers or tzatziki  o Tuna salad on whole wheat bread: tuna salad made with greek yogurt, olives, red peppers, capers, green onions o Garlic rosemary lamb pita: lamb sauted with garlic, rosemary, salt & pepper; add lettuce, cucumber, greek yogurt to pita - flavor with lemon juice and black pepper  . Seafood:  o Mediterranean grilled salmon, seasoned with garlic, basil, parsley, lemon juice and black pepper o Shrimp, lemon, and spinach whole-grain pasta salad made with low fat greek yogurt  o Seared scallops with lemon orzo  o Seared tuna steaks seasoned salt, pepper, coriander topped with tomato mixture of olives, tomatoes, olive oil, minced garlic, parsley, green onions and cappers  . Meats:  o Herbed greek chicken salad with kalamata olives, cucumber, feta  o Red bell peppers stuffed with spinach, bulgur, lean ground beef (or lentils) & topped with feta   o Kebabs: skewers of chicken, tomatoes, onions, zucchini, squash  o Turkey burgers: made with red onions, mint, dill, lemon juice, feta cheese topped with roasted red peppers . Vegetarian o Cucumber salad: cucumbers, artichoke hearts, celery, red onion, feta cheese, tossed in  olive oil & lemon juice  o Hummus and whole grain pita points with a greek salad (lettuce, tomato, feta, olives, cucumbers, red onion) o Lentil soup with celery, carrots made with vegetable broth, garlic, salt and pepper  o Tabouli salad: parsley, bulgur, mint, scallions, cucumbers, tomato, radishes, lemon juice, olive oil, salt and pepper.      

## 2016-02-14 ENCOUNTER — Ambulatory Visit: Payer: BLUE CROSS/BLUE SHIELD | Admitting: Physician Assistant

## 2016-03-18 ENCOUNTER — Ambulatory Visit: Payer: BLUE CROSS/BLUE SHIELD | Admitting: Family Medicine

## 2016-04-07 ENCOUNTER — Ambulatory Visit (INDEPENDENT_AMBULATORY_CARE_PROVIDER_SITE_OTHER): Payer: BLUE CROSS/BLUE SHIELD | Admitting: Physician Assistant

## 2016-04-07 ENCOUNTER — Encounter: Payer: Self-pay | Admitting: Physician Assistant

## 2016-04-07 VITALS — BP 112/68 | HR 76 | Temp 98.4°F | Resp 16 | Wt 231.0 lb

## 2016-04-07 DIAGNOSIS — Z111 Encounter for screening for respiratory tuberculosis: Secondary | ICD-10-CM | POA: Diagnosis not present

## 2016-04-07 DIAGNOSIS — Z23 Encounter for immunization: Secondary | ICD-10-CM

## 2016-04-07 NOTE — Patient Instructions (Signed)
Td Vaccine (Tetanus and Diphtheria): What You Need to Know  1. Why get vaccinated?  Tetanus  and diphtheria are very serious diseases. They are rare in the [place] today, but people who do become infected often have severe complications. Td vaccine is used to protect adolescents and adults from both of these diseases.  Both tetanus and diphtheria are infections caused by bacteria. Diphtheria spreads from person to person through coughing or sneezing. Tetanus-causing bacteria enter the body through cuts, scratches, or wounds.  TETANUS (Lockjaw) causes painful muscle tightening and stiffness, usually all over the body.  · It can lead to tightening of muscles in the head and neck so you can't open your mouth, swallow, or sometimes even breathe. Tetanus kills about 1 out of every 10 people who are infected even after receiving the best medical care.  DIPHTHERIA can cause a thick coating to form in the back of the throat.  · It can lead to breathing problems, paralysis, heart failure, and death.  Before vaccines, as many as 200,000 cases of diphtheria and hundreds of cases of tetanus were reported in the [place] each year. Since vaccination began, reports of cases for both diseases have dropped by about 99%.  2. Td vaccine  Td vaccine can protect adolescents and adults from tetanus and diphtheria. Td is usually given as a booster dose every 10 years but it can also be given earlier after a severe and dirty wound or burn.  Another vaccine, called Tdap, which protects against pertussis in addition to tetanus and diphtheria, is sometimes recommended instead of Td vaccine.  Your doctor or the person giving you the vaccine can give you more information.  Td may safely be given at the same time as other vaccines.  3. Some people should not get this vaccine  · A person who has ever had a life-threatening allergic reaction after a previous dose of any tetanus or diphtheria containing vaccine, OR has a severe allergy  to any part of this vaccine, should not get Td vaccine. Tell the person giving the vaccine about any severe allergies.  · Talk to your doctor if you:    have seizures or another nervous system problem,    had severe pain or swelling after any vaccine containing diphtheria or tetanus,    ever had a condition called Guillain Barre Syndrome (GBS),    aren't feeling well on the day the shot is scheduled.  4. Risks of a vaccine reaction  With any medicine, including vaccines, there is a chance of side effects. These are usually mild and go away on their own. Serious reactions are also possible but are rare.  Most people who get Td vaccine do not have any problems with it.  Mild Problems  following Td vaccine:  (Did not interfere with activities)  · Pain where the shot was given (about 8 people in 10)  · Redness or swelling where the shot was given (about 1 person in 4)  · Mild fever (rare)  · Headache (about 1 person in 4)  · Tiredness (about 1 person in 4)  Moderate Problems following Td vaccine:  (Interfered with activities, but did not require medical attention)  · Fever over 102°F (rare)  Severe Problems  following Td vaccine:  (Unable to perform usual activities; required medical attention)  · Swelling, severe pain, bleeding and/or redness in the arm where the shot was given (rare).  Problems that could happen after any vaccine:  · People sometimes   faint after a medical procedure, including vaccination. Sitting or lying down for about 15 minutes can help prevent fainting, and injuries caused by a fall. Tell your doctor if you feel dizzy, or have vision changes or ringing in the ears.  · Some people get severe pain in the shoulder and have difficulty moving the arm where a shot was given. This happens very rarely.  · Any medication can cause a severe allergic reaction. Such reactions from a vaccine are very rare, estimated at fewer than 1 in a million doses, and would happen within a few minutes to a few hours after  the vaccination.  As with any medicine, there is a very remote chance of a vaccine causing a serious injury or death.  The safety of vaccines is always being monitored. For more information, visit: www.cdc.gov/vaccinesafety/  5. What if there is a serious reaction?  What should I look for?  · Look for anything that concerns you, such as signs of a severe allergic reaction, very high fever, or unusual behavior.  Signs of a severe allergic reaction can include hives, swelling of the face and throat, difficulty breathing, a fast heartbeat, dizziness, and weakness. These would usually start a few minutes to a few hours after the vaccination.  What should I do?  · If you think it is a severe allergic reaction or other emergency that can't wait, call 9-1-1 or get the person to the nearest hospital. Otherwise, call your doctor.  · Afterward, the reaction should be reported to the Vaccine Adverse Event Reporting System (VAERS). Your doctor might file this report, or you can do it yourself through the VAERS web site at www.vaers.hhs.gov, or by calling 1-800-822-7967.  VAERS does not give medical advice.  6. The National Vaccine Injury Compensation Program  The National Vaccine Injury Compensation Program (VICP) is a federal program that was created to compensate people who may have been injured by certain vaccines.  Persons who believe they may have been injured by a vaccine can learn about the program and about filing a claim by calling 1-800-338-2382 or visiting the VICP website at www.hrsa.gov/vaccinecompensation. There is a time limit to file a claim for compensation.  7. How can I learn more?  · Ask your doctor. He or she can give you the vaccine package insert or suggest other sources of information.  · Call your local or state health department.  · Contact the Centers for Disease Control and Prevention (CDC):    Call 1-800-232-4636 (1-800-CDC-INFO)    Visit CDC's website at www.cdc.gov/vaccines  CDC Td Vaccine VIS  (09/05/13)     This information is not intended to replace advice given to you by your health care provider. Make sure you discuss any questions you have with your health care provider.     Document Released: 04/26/2006 Document Revised: 07/20/2014 Document Reviewed: 10/11/2013  Elsevier Interactive Patient Education ©2016 [employer]

## 2016-04-07 NOTE — Progress Notes (Signed)
        Patient: Rhonda Gillespie L Pat Female    DOB: 10/19/1985   30 y.o.   MRN: [id number] Visit Date: 04/07/2016  Today's Provider: Trey SailorsAdriana M Pollak, PA-C   Chief Complaint  Patient presents with  . Medical Clearance    Pt needs employment form filled out.  She will also need a TB screening test.   Subjective:    HPI   Pt comes in today to have her employment form filled out.  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  She reports feeling well her ROS is completely negative.  She is due for a flu shot and Tdap which she agreed to get today.  Also she needs a TB screening.      Allergies  Allergen Reactions  . Amoxicillin Hives    No current outpatient prescriptions on file.  Review of Systems  Constitutional: Negative.   Respiratory: Negative.   Cardiovascular: Negative.   Gastrointestinal: Negative.   Musculoskeletal: Negative.   Neurological: Negative for dizziness, tremors, seizures, syncope, weakness, light-headedness and headaches.  Psychiatric/Behavioral: Negative for behavioral problems, decreased concentration, dysphoric mood and sleep disturbance. The patient is not nervous/anxious.     Social History  Substance Use Topics  . Smoking status: Never Smoker  . Smokeless tobacco: Never Used  . Alcohol use No   Objective:   BP 112/68 (BP Location: Left Arm, Patient Position: Sitting, Cuff Size: Large)   Pulse 76   Temp 98.4 F (36.9 C) (Oral)   Resp 16   Wt 231 lb (104.8 kg)   LMP 03/30/2016   BMI 39.65 kg/m   Physical Exam  Constitutional: She is oriented to person, place, and time. She appears well-developed and well-nourished. No distress.  HENT:  Head: Normocephalic and atraumatic.  Eyes: Pupils are equal, round, and reactive to light. Right eye exhibits no discharge. Left eye exhibits no discharge.  Neck: Normal range of motion. Neck supple.  Cardiovascular: Normal rate, regular rhythm and intact distal pulses.  Exam reveals no  gallop and no friction rub.   No murmur heard. Pulmonary/Chest: Effort normal and breath sounds normal. No respiratory distress. She has no wheezes. She has no rales. She exhibits no tenderness.  Abdominal: Soft. Bowel sounds are normal. She exhibits no distension. There is no tenderness.  Musculoskeletal: Normal range of motion. She exhibits no edema.  Patient able to bend over and squat.  Neurological: She is alert and oriented to person, place, and time.  Skin: Skin is warm and dry. She is not diaphoretic. No erythema.  Psychiatric: She has a normal mood and affect. Her behavior is normal.        Assessment & Plan:         Patient is here to complete pre-employment form. Flu vaccine and Tdap administered in office today. Quantiferon gold for TB screen ordered as well. Work form completed and returned to patient. TB form to be completed upon return of results, patient may pick this up at the office.    Trey SailorsAdriana M Pollak, PA-C  [facility]

## 2016-04-09 ENCOUNTER — Telehealth: Payer: Self-pay

## 2016-04-09 LAB — QUANTIFERON IN TUBE
QFT TB AG MINUS NIL VALUE: 0 IU/mL
QUANTIFERON MITOGEN VALUE: >10 IU/mL
QUANTIFERON TB AG VALUE: 0.02 IU/mL
QUANTIFERON TB GOLD: NEGATIVE
Quantiferon Nil Value: 0.02 IU/mL

## 2016-04-09 LAB — QUANTIFERON TB GOLD ASSAY (BLOOD)

## 2016-04-09 NOTE — Telephone Encounter (Signed)
-----   Message from Trey SailorsAdriana M Pollak, [place]PA-C sent at 04/09/2016  8:01 AM EDT ----- TB screening negative. Will sign employment form and patient may pick up in the office. Please advise patient.

## 2016-04-09 NOTE — Telephone Encounter (Signed)
Advised patient as below.  

## 2016-04-09 NOTE — Telephone Encounter (Signed)
Left message to call back  

## 2016-05-27 ENCOUNTER — Telehealth: Payer: Self-pay | Admitting: Emergency Medicine

## 2016-05-27 ENCOUNTER — Encounter: Payer: Self-pay | Admitting: Physician Assistant

## 2016-05-27 NOTE — Telephone Encounter (Signed)
Letter placed up front  

## 2016-05-27 NOTE — Telephone Encounter (Signed)
Pt would like a letter or some sort of documentation that she had her CPE and TB skin test. Ricki Rodriguez(Adriana did this for her in September) She is trying to get a job and needs proof of this. She would like to get it by Friday if at all possible. Thanks.

## 2017-04-13 ENCOUNTER — Telehealth: Payer: Self-pay

## 2017-04-13 NOTE — Telephone Encounter (Signed)
LMTCB to schedule CPE °

## 2018-08-02 ENCOUNTER — Emergency Department: Payer: BC Managed Care – PPO

## 2018-08-02 ENCOUNTER — Encounter: Payer: Self-pay | Admitting: Emergency Medicine

## 2018-08-02 ENCOUNTER — Emergency Department
Admission: EM | Admit: 2018-08-02 | Discharge: 2018-08-02 | Disposition: A | Discharge status: Home / Self Care | Payer: BC Managed Care – PPO | Attending: Emergency Medicine | Admitting: Emergency Medicine

## 2018-08-02 ENCOUNTER — Other Ambulatory Visit: Payer: Self-pay

## 2018-08-02 DIAGNOSIS — K5720 Diverticulitis of large intestine with perforation and abscess without bleeding: Secondary | ICD-10-CM | POA: Insufficient documentation

## 2018-08-02 DIAGNOSIS — K5732 Diverticulitis of large intestine without perforation or abscess without bleeding: Secondary | ICD-10-CM

## 2018-08-02 DIAGNOSIS — R1031 Right lower quadrant pain: Secondary | ICD-10-CM | POA: Diagnosis present

## 2018-08-02 DIAGNOSIS — R102 Pelvic and perineal pain: Secondary | ICD-10-CM | POA: Insufficient documentation

## 2018-08-02 LAB — URINALYSIS, COMPLETE (UACMP) WITH MICROSCOPIC
Bilirubin Urine: NEGATIVE
GLUCOSE, UA: NEGATIVE mg/dL
Ketones, ur: NEGATIVE mg/dL
Leukocytes, UA: NEGATIVE
Nitrite: NEGATIVE
Protein, ur: NEGATIVE mg/dL
Specific Gravity, Urine: 1.004 — ABNORMAL LOW (ref 1.005–1.030)
WBC, UA: NONE SEEN WBC/hpf (ref 0–5)
pH: 5 (ref 5.0–8.0)

## 2018-08-02 LAB — POCT PREGNANCY, URINE: Preg Test, Ur: NEGATIVE

## 2018-08-02 LAB — COMPREHENSIVE METABOLIC PANEL
ALBUMIN: 3.9 g/dL (ref 3.5–5.0)
ALT: 21 U/L (ref 0–44)
AST: 19 U/L (ref 15–41)
Alkaline Phosphatase: 76 U/L (ref 38–126)
Anion gap: 7 (ref 5–15)
BILIRUBIN TOTAL: 0.8 mg/dL (ref 0.3–1.2)
BUN: 9 mg/dL (ref 6–20)
CO2: 29 mmol/L (ref 22–32)
Calcium: 8.9 mg/dL (ref 8.9–10.3)
Chloride: 101 mmol/L (ref 98–111)
Creatinine, Ser: 0.7 mg/dL (ref 0.44–1.00)
GFR calc Af Amer: >60 mL/min (ref >60)
GFR calc non Af Amer: >60 mL/min (ref >60)
GLUCOSE: 107 mg/dL — AB (ref 70–99)
Potassium: 3.7 mmol/L (ref 3.5–5.1)
Sodium: 137 mmol/L (ref 135–145)
TOTAL PROTEIN: 7.7 g/dL (ref 6.5–8.1)

## 2018-08-02 LAB — CBC
HCT: 43.4 % (ref 36.0–46.0)
Hemoglobin: 14.3 g/dL (ref 12.0–15.0)
MCH: 28.7 pg (ref 26.0–34.0)
MCHC: 32.9 g/dL (ref 30.0–36.0)
MCV: 87.1 fL (ref 80.0–100.0)
PLATELETS: 309 10*3/uL (ref 150–400)
RBC: 4.98 MIL/uL (ref 3.87–5.11)
RDW: 12.5 % (ref 11.5–15.5)
WBC: 10.8 10*3/uL — ABNORMAL HIGH (ref 4.0–10.5)
nRBC: 0 % (ref 0.0–0.2)

## 2018-08-02 LAB — LIPASE, BLOOD: Lipase: 22 U/L (ref 11–51)

## 2018-08-02 MED ORDER — CIPROFLOXACIN HCL 500 MG PO TABS
500.0000 mg | ORAL_TABLET | Freq: Once | ORAL | Status: AC
  Administered 2018-08-02: 500 mg via ORAL
  Filled 2018-08-02: qty 1

## 2018-08-02 MED ORDER — IOHEXOL 300 MG/ML  SOLN
100.0000 mL | Freq: Once | INTRAMUSCULAR | Status: AC | PRN
  Administered 2018-08-02: 100 mL via INTRAVENOUS
  Filled 2018-08-02: qty 100

## 2018-08-02 MED ORDER — METRONIDAZOLE 500 MG PO TABS
500.0000 mg | ORAL_TABLET | Freq: Once | ORAL | Status: AC
  Administered 2018-08-02: 500 mg via ORAL
  Filled 2018-08-02: qty 1

## 2018-08-02 MED ORDER — SULFAMETHOXAZOLE-TRIMETHOPRIM 800-160 MG PO TABS: 1.0000 | ORAL_TABLET | Freq: Two times a day (BID) | ORAL | 0 refills | Status: AC

## 2018-08-02 MED ORDER — METRONIDAZOLE 500 MG PO TABS: 500.0000 mg | ORAL_TABLET | Freq: Three times a day (TID) | ORAL | 0 refills | Status: AC

## 2018-08-02 NOTE — ED Notes (Signed)
Patient transported to CT 

## 2018-08-02 NOTE — ED Triage Notes (Signed)
FIRST NURSE NOTE-here for RLQ pain. NAD. Color WNL.

## 2018-08-02 NOTE — ED Provider Notes (Signed)
[facility] Emergency Department Provider Note _____________   First MD Initiated Contact with Patient 08/02/18 1722     (approximate)  I have reviewed the triage vital signs and the nursing notes.   HISTORY  Chief Complaint Abdominal Pain   HPI Rhonda Gillespie is a 33 y.o. female presents to the emergency department with intermittent right lower quadrant abdominal pain x3 weeks with a "low-grade temperature today per the patient.  Patient denies any nausea vomiting diarrhea patient denies any urinary symptoms.  Patient states that pain reoccurred today.  However patient states that pain is mild at present.  Past Medical History:  Diagnosis Date  . Migraine headache     Patient Active Problem List   Diagnosis Date Noted  . Obese 01/16/2016  . BMI 39.0-39.9,adult 01/16/2016    Past Surgical History:  Procedure Laterality Date  . CESAREAN SECTION  2011,2012,2014   x3  . TUBAL LIGATION  2014    Prior to Admission medications   Medication Sig Start Date End Date Taking? Authorizing Provider  metroNIDAZOLE (FLAGYL) 500 MG tablet Take 1 tablet (500 mg total) by mouth 3 (three) times daily for 10 days. 08/02/18 08/12/18  Darci CurrentBrown, Edgar N, MD  sulfamethoxazole-trimethoprim (BACTRIM DS,SEPTRA DS) 800-160 MG tablet Take 1 tablet by mouth 2 (two) times daily for 10 days. 08/02/18 08/12/18  Darci CurrentBrown, [place] N, MD    Allergies Amoxicillin  Family History  Problem Relation Age of Onset  . Depression Mother   . Depression Father   . Diabetes Father   . Heart disease Maternal Grandmother   . Diabetes Maternal Grandmother   . Depression Maternal Grandfather   . Cancer Paternal Grandfather     Social History Social History   Tobacco Use  . Smoking status: Never Smoker  . Smokeless tobacco: Never Used  Substance Use Topics  . Alcohol use: No  . Drug use: No    Review of Systems Constitutional: No fever/chills Eyes: No visual changes. ENT: No  sore throat. Cardiovascular: Denies chest pain. Respiratory: Denies shortness of breath. Gastrointestinal: Positive for abdominal pain.  No nausea, no vomiting.  No diarrhea.  No constipation. Genitourinary: Negative for dysuria. Musculoskeletal: Negative for neck pain.  Negative for back pain. Integumentary: Negative for rash. Neurological: Negative for headaches, focal weakness or numbness.   ____________________________________________   PHYSICAL EXAM:  VITAL SIGNS: ED Triage Vitals  Enc Vitals Group     BP 08/02/18 1919 117/68     Pulse Rate 08/02/18 1919 100     Resp 08/02/18 1919 16     Temp --      Temp src --      SpO2 08/02/18 1919 100 %     Weight 08/02/18 1654 106.1 kg (234 lb)     Height 08/02/18 1654 1.6 m (5\' 3" )     Head Circumference --      Peak Flow --      Pain Score 08/02/18 1653 5     Pain Loc --      Pain Edu? --      Excl. in GC? --     Constitutional: Alert and oriented. Well appearing and in no acute distress. Eyes: Conjunctivae are normal.  Mouth/Throat: Mucous membranes are moist.  Oropharynx non-erythematous. Neck: No stridor.   Cardiovascular: Normal rate, regular rhythm. Good peripheral circulation. Grossly normal heart sounds. Respiratory: Normal respiratory effort.  No retractions. Lungs CTAB. Gastrointestinal: Pain with right and left lower quadrant palpation.  No  distention.  Musculoskeletal: No lower extremity tenderness nor edema. No gross deformities of extremities. Neurologic:  Normal speech and language. No gross focal neurologic deficits are appreciated.  Skin:  Skin is warm, dry and intact. No rash noted. Psychiatric: Mood and affect are normal. Speech and behavior are normal.  ____________________________________________   LABS (all labs ordered are listed, but only abnormal results are displayed)  Labs Reviewed  COMPREHENSIVE METABOLIC PANEL - Abnormal; Notable for the following components:      Result Value   Glucose,  Bld 107 (*)    All other components within normal limits  CBC - Abnormal; Notable for the following components:   WBC 10.8 (*)    All other components within normal limits  URINALYSIS, COMPLETE (UACMP) WITH MICROSCOPIC - Abnormal; Notable for the following components:   Color, Urine STRAW (*)    APPearance CLEAR (*)    Specific Gravity, Urine 1.004 (*)    Hgb urine dipstick SMALL (*)    Bacteria, UA RARE (*)    All other components within normal limits  LIPASE, BLOOD  POC URINE PREG, ED  POCT PREGNANCY, URINE   _________________  RADIOLOGY I, Hitch[place], personally viewed and evaluated these images (plain radiographs) as part of my medical decision making, as well as reviewing the written report by the radiologist.  ED MD interpretation: Pelvic ultrasound revealed no acute abnormality.  CT scan abdomen pelvis however did reveal acute sigmoid diverticulitis  Official radiology report(s): Ct Abdomen Pelvis W Contrast  Result Date: 08/02/2018 CLINICAL DATA:  33 y/o F; 3 weeks of right lower quadrant abdominal pain. EXAM: CT ABDOMEN AND PELVIS WITH CONTRAST TECHNIQUE: Multidetector CT imaging of the abdomen and pelvis was performed using the standard protocol following bolus administration of intravenous contrast. CONTRAST:  100mL OMNIPAQUE IOHEXOL 300 MG/ML  SOLN COMPARISON:  None. FINDINGS: Lower chest: No acute abnormality. Hepatobiliary: No focal liver abnormality is seen. No gallstones, gallbladder wall thickening, or biliary dilatation. Pancreas: Unremarkable. No pancreatic ductal dilatation or surrounding inflammatory changes. Spleen: Normal in size without focal abnormality. Adrenals/Urinary Tract: Adrenal glands are unremarkable. Kidneys are normal, without renal calculi, focal lesion, or hydronephrosis. Bladder is unremarkable. Stomach/Bowel: Acute sigmoid diverticulitis (series 5, image 42). No findings of perforation or abscess. No obstructive or inflammatory changes of  stomach or small bowel. Normal appendix. Vascular/Lymphatic: No significant vascular findings are present. No enlarged abdominal or pelvic lymph nodes. Reproductive: No acute process. Nabothian cysts noted. Other: No abdominal wall hernia or abnormality. No abdominopelvic ascites. Musculoskeletal: No acute or significant osseous findings. IMPRESSION: Acute sigmoid diverticulitis. No perforation or abscess. Electronically Signed   By: Mitzi HansenLance  Furusawa-Stratton M.D.   On: 08/02/2018 19:00   [place] Pelvic Complete With Transvaginal  Result Date: 08/02/2018 CLINICAL DATA:  Pelvic pain for 3 weeks EXAM: TRANSABDOMINAL AND TRANSVAGINAL ULTRASOUND OF PELVIS TECHNIQUE: Both transabdominal and transvaginal ultrasound examinations of the pelvis were performed. Transabdominal technique was performed for global imaging of the pelvis including uterus, ovaries, adnexal regions, and pelvic cul-de-sac. It was necessary to proceed with endovaginal exam following the transabdominal exam to visualize the uterus and ovaries. COMPARISON:  None FINDINGS: Uterus Measurements: 8.6 x 5.3 x 7.0 cm = volume: 167 mL. No fibroids or other mass visualized. Uterus is heterogeneous. Endometrium Thickness: 7 mm.  No focal abnormality visualized. Right ovary Measurements: 3.0 x 2.2 x 2.1 cm = volume: 7.1 mL. Normal appearance/no adnexal mass. Left ovary Not visualized. No free fluid in the pelvis. IMPRESSION: 1. No acute  abnormality of the uterus or right ovary. 2. Heterogeneous appearance of the uterus could indicate adenomyosis. 3. Left ovary not visualized. Electronically Signed   By: Deatra Robinson M.D.   On: 08/02/2018 18:54      Procedures   ____________________________________________   INITIAL IMPRESSION / ASSESSMENT AND PLAN / ED COURSE  As part of my medical decision making, I reviewed the following data within the electronic MEDICAL RECORD NUMBER   33 year old female presenting with above-stated history and physical exam secondary  to abdominal pain.  Consider the possibility of appendicitis ovarian cyst diverticulitis.  Pelvic ultrasound initially performed which revealed no acute pelvic abnormality.  As such CT scan of the abdomen pelvis was performed which revealed evidence of sigmoid diverticulitis.  Patient will be prescribed Bactrim and Flagyl for home.  Spoke with the patient and family at length regarding all clinical findings. ____________________________________________  FINAL CLINICAL IMPRESSION(S) / ED DIAGNOSES  Final diagnoses:  Pelvic pain  Sigmoid diverticulitis     MEDICATIONS GIVEN DURING THIS VISIT:  Medications  iohexol (OMNIPAQUE) 300 MG/ML solution 100 mL (100 mLs Intravenous Contrast Given 08/02/18 1843)  ciprofloxacin (CIPRO) tablet 500 mg (500 mg Oral Given 08/02/18 1918)  metroNIDAZOLE (FLAGYL) tablet 500 mg (500 mg Oral Given 08/02/18 1918)     ED Discharge Orders         Ordered    metroNIDAZOLE (FLAGYL) 500 MG tablet  3 times daily     08/02/18 1932    sulfamethoxazole-trimethoprim (BACTRIM DS,SEPTRA DS) 800-160 MG tablet  2 times daily     08/02/18 1932           Note:  This document was prepared using Dragon voice recognition software and may include unintentional dictation errors.    Darci Current, MD 08/02/18 2157

## 2018-08-02 NOTE — ED Triage Notes (Signed)
Here for RLQ intermittent X 3 week.  Low grade temp today per pt.  Denies NVD,.    Denies vaginal bleeding. Denies urinary sx.   NAD.  Alert and oriented.

## 2018-08-24 ENCOUNTER — Ambulatory Visit
Admission: RE | Admit: 2018-08-24 | Discharge: 2018-08-24 | Disposition: A | Discharge status: Home / Self Care | Payer: BC Managed Care – PPO | Source: Ambulatory Visit | Attending: Surgery | Admitting: Surgery

## 2018-08-24 ENCOUNTER — Other Ambulatory Visit: Payer: Self-pay | Admitting: Surgery

## 2018-08-24 DIAGNOSIS — K3580 Unspecified acute appendicitis: Secondary | ICD-10-CM

## 2018-08-24 DIAGNOSIS — R1031 Right lower quadrant pain: Secondary | ICD-10-CM | POA: Diagnosis present

## 2018-08-24 MED ORDER — IOPAMIDOL (ISOVUE-300) INJECTION 61%
100.0000 mL | Freq: Once | INTRAVENOUS | Status: AC | PRN
  Administered 2018-08-24: 100 mL via INTRAVENOUS

## 2018-09-23 ENCOUNTER — Other Ambulatory Visit (HOSPITAL_COMMUNITY): Payer: Self-pay | Admitting: Internal Medicine

## 2018-09-23 ENCOUNTER — Other Ambulatory Visit: Payer: Self-pay | Admitting: Internal Medicine

## 2018-09-23 DIAGNOSIS — R1011 Right upper quadrant pain: Secondary | ICD-10-CM

## 2018-09-30 ENCOUNTER — Ambulatory Visit: Payer: BC Managed Care – PPO

## 2019-01-31 ENCOUNTER — Other Ambulatory Visit: Payer: Self-pay

## 2019-01-31 DIAGNOSIS — Z20822 Contact with and (suspected) exposure to covid-19: Secondary | ICD-10-CM

## 2019-02-02 LAB — NOVEL CORONAVIRUS, NAA: SARS-CoV-2, NAA: NOT DETECTED

## 2019-02-07 ENCOUNTER — Telehealth: Payer: Self-pay | Admitting: Internal Medicine

## 2019-02-07 NOTE — Telephone Encounter (Signed)
Patient informed of negative test results.

## 2019-05-24 ENCOUNTER — Other Ambulatory Visit: Payer: Self-pay

## 2019-05-24 DIAGNOSIS — Z20822 Contact with and (suspected) exposure to covid-19: Secondary | ICD-10-CM

## 2019-05-25 LAB — NOVEL CORONAVIRUS, NAA: SARS-CoV-2, NAA: DETECTED — AB

## 2019-08-18 IMAGING — US US PELVIS COMPLETE WITH TRANSVAGINAL
1 series · 14 of 25 positions shown · non-contrast
Comparison: None

CLINICAL DATA: Pelvic pain for 3 weeks

EXAM:
TRANSABDOMINAL AND TRANSVAGINAL ULTRASOUND OF PELVIS
TECHNIQUE: Both transabdominal and transvaginal ultrasound examinations of the
pelvis were performed. Transabdominal technique was performed for
global imaging of the pelvis including uterus, ovaries, adnexal
regions, and pelvic cul-de-sac. It was necessary to proceed with
endovaginal exam following the transabdominal exam to visualize the
uterus and ovaries.

[Series 1: us pelvis complete with transvaginal · 0.25mm/px · 14 of 74 slices shown]
[im 1/74]
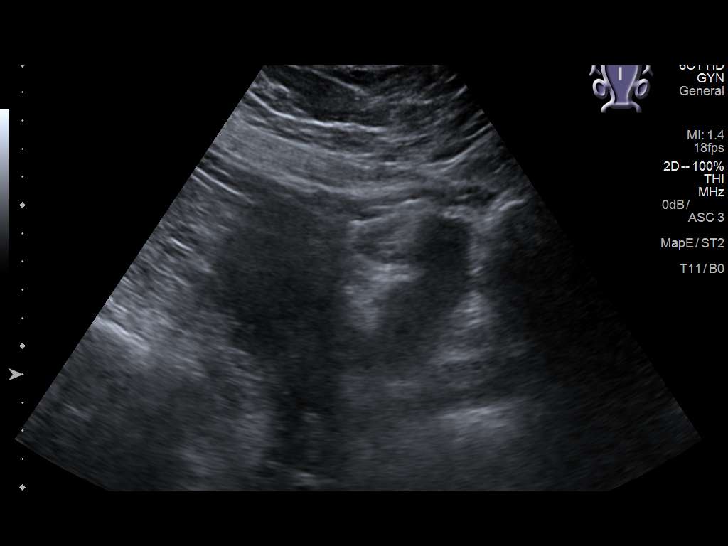
[im 7/74]
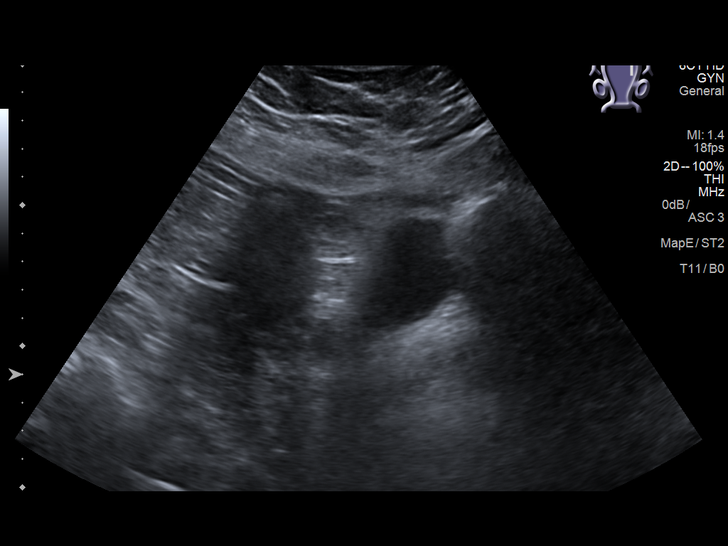
[im 13/74]
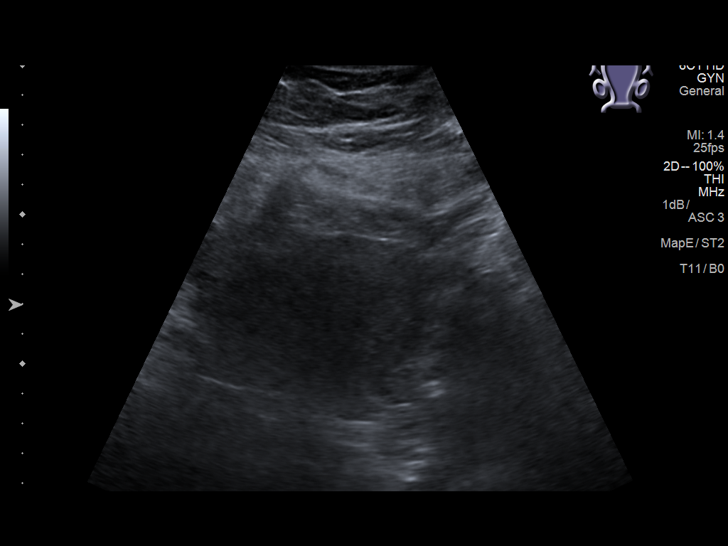
[im 19/74]
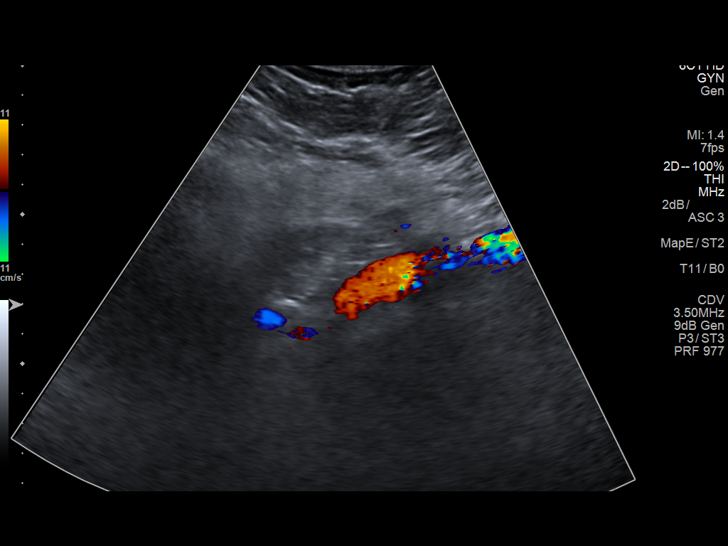
[im 25/74]
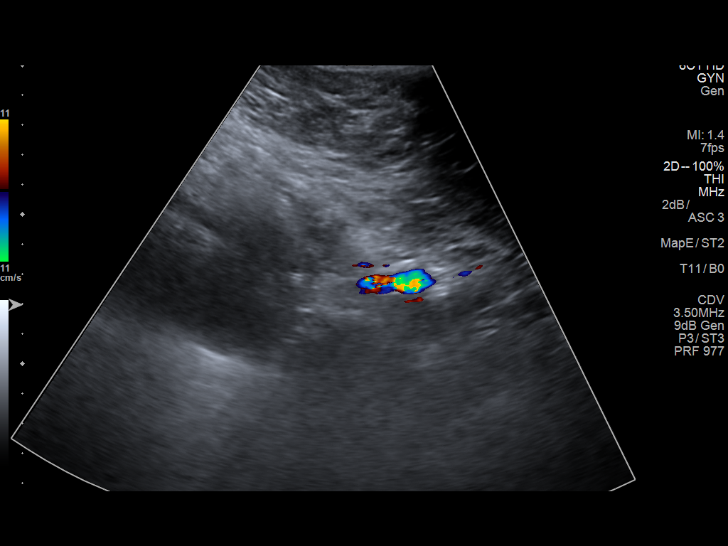
[im 28/74]
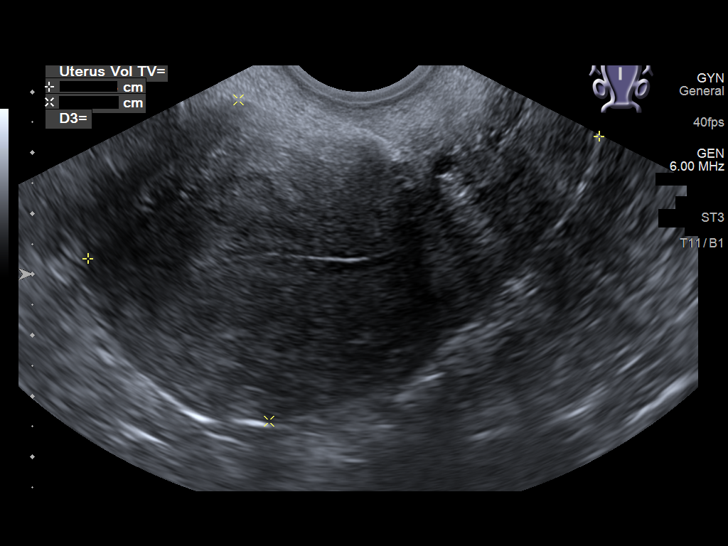
[im 34/74]
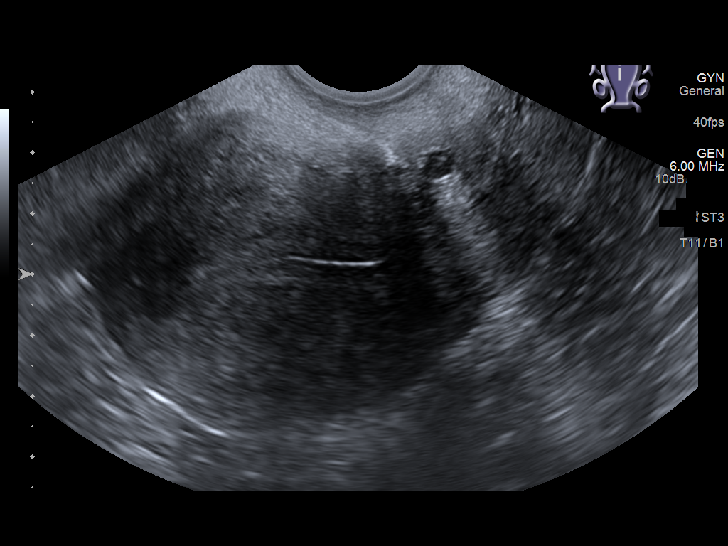
[im 40/74]
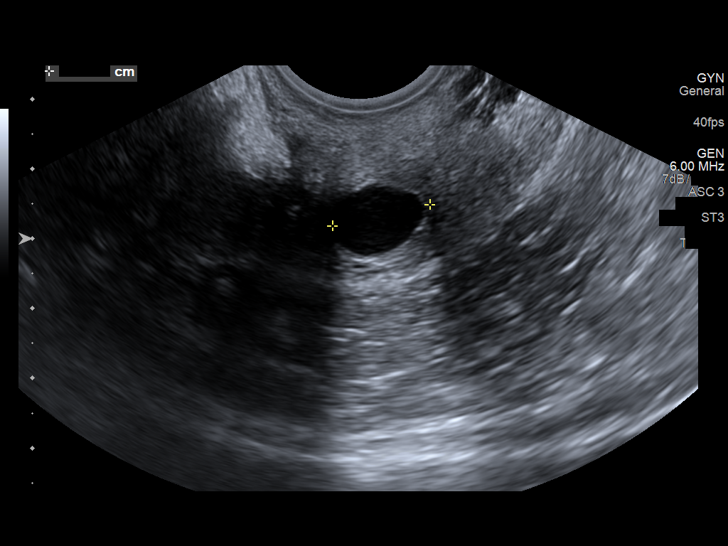
[im 46/74]
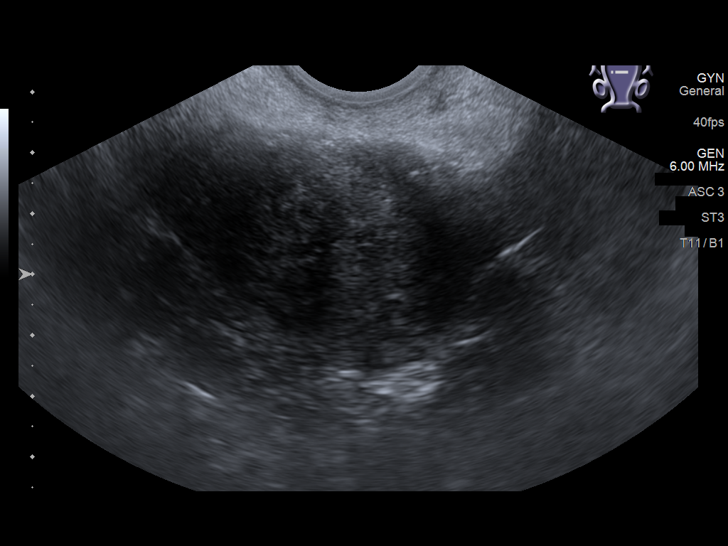
[im 49/74]
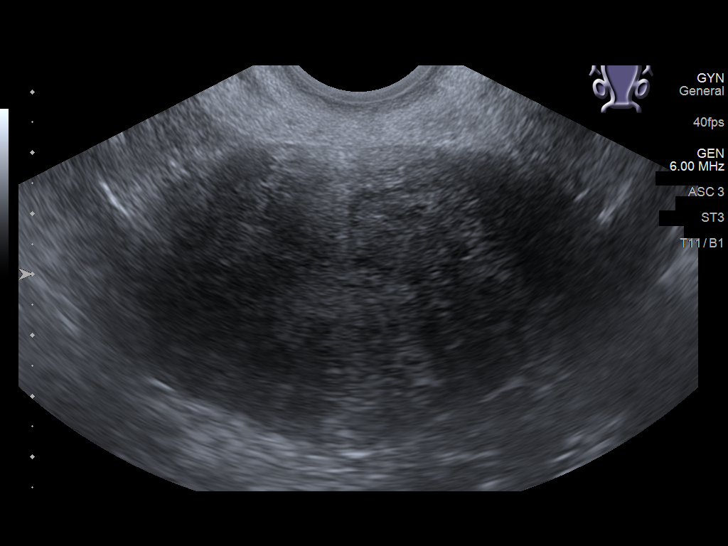
[im 55/74]
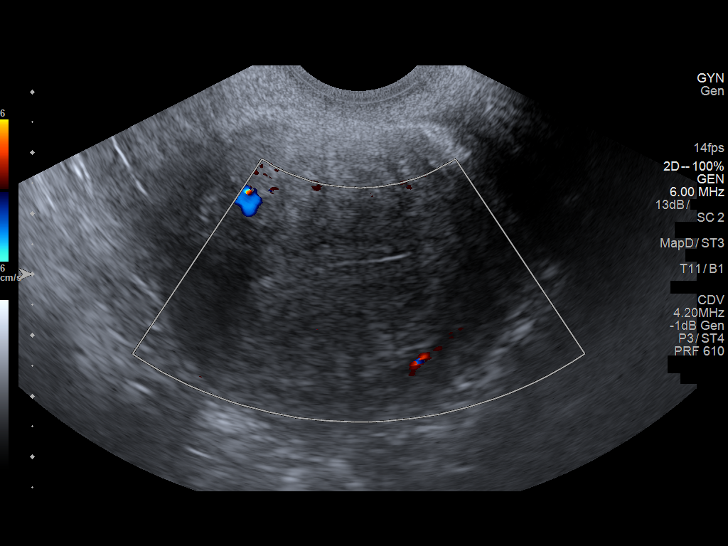
[im 61/74]
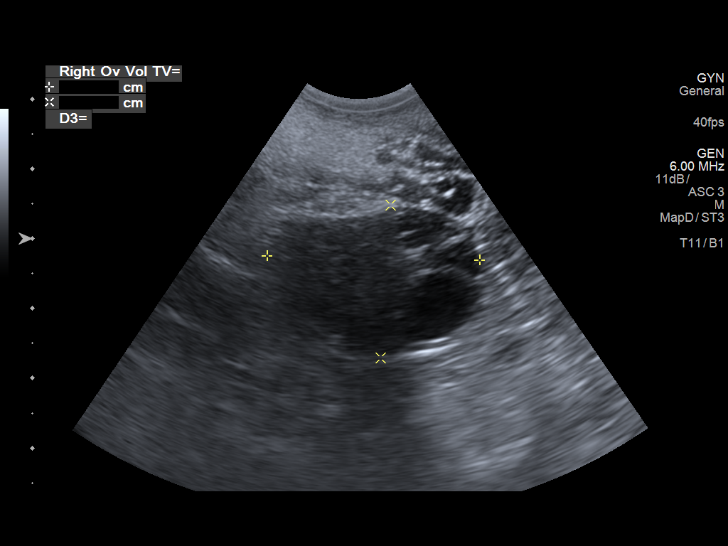
[im 67/74]
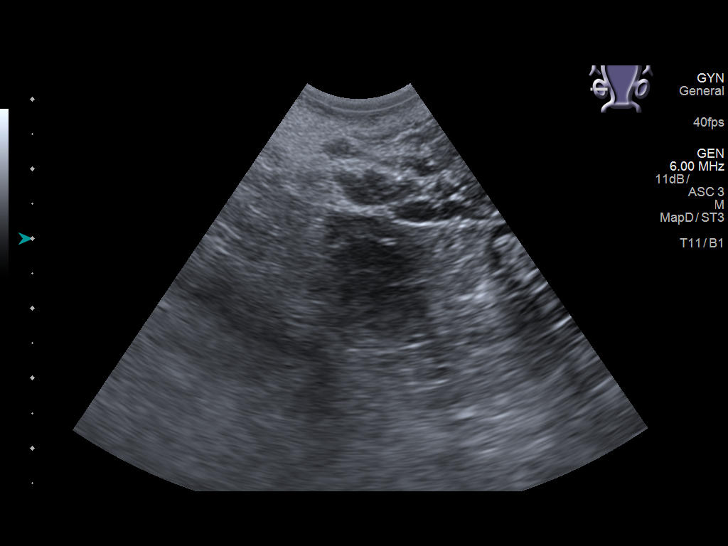
[im 74/74]
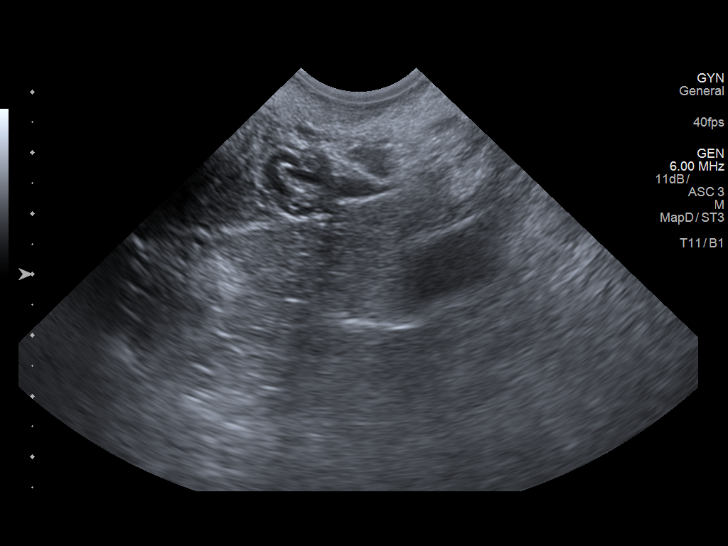

[14 of 25 positions shown; findings below may reference images not displayed]

FINDINGS: Uterus

Measurements: 8.6 x 5.3 x 7.0 cm = volume: 167 mL. No fibroids or
other mass visualized. Uterus is heterogeneous.

Endometrium

Thickness: 7 mm.  No focal abnormality visualized.

Right ovary

Measurements: 3.0 x 2.2 x 2.1 cm = volume: 7.1 mL. Normal
appearance/no adnexal mass.

Left ovary

Not visualized.

No free fluid in the pelvis.
IMPRESSION: 1. No acute abnormality of the uterus or right ovary.
2. Heterogeneous appearance of the uterus could indicate
adenomyosis.
3. Left ovary not visualized.

## 2020-01-05 IMAGING — CT CT ABD-PELV W/ CM
2 of 4 series · 17 of 46 positions shown, 19 images · IV contrast (omnipaque)
Comparison: None.

CLINICAL DATA: 32 y/o F; 3 weeks of right lower quadrant abdominal
pain.

EXAM:
CT ABDOMEN AND PELVIS WITH CONTRAST
TECHNIQUE: Multidetector CT imaging of the abdomen and pelvis was performed
using the standard protocol following bolus administration of
intravenous contrast.
CONTRAST:  100mL OMNIPAQUE IOHEXOL 300 MG/ML  SOLN

[Series 2: routine abd/pel with · axial · 0.91mm/px · z∈[-508,-53]mm · 14 of 101 slices shown, 16 images]
[im 5/101  soft-tissue]
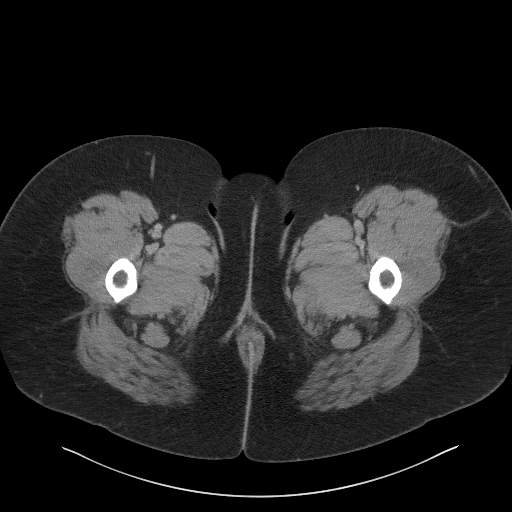
[im 5/101  bone]
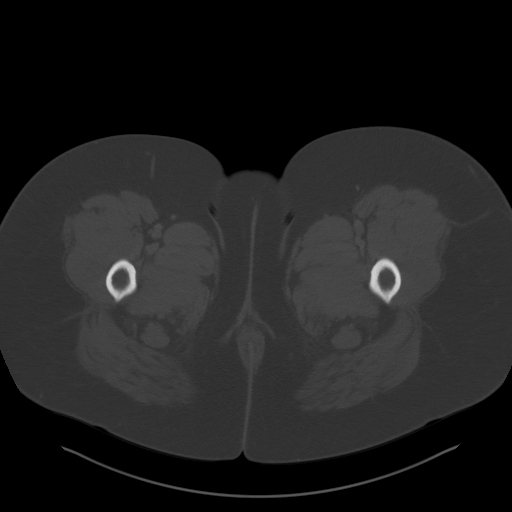
[im 13/101  soft-tissue]
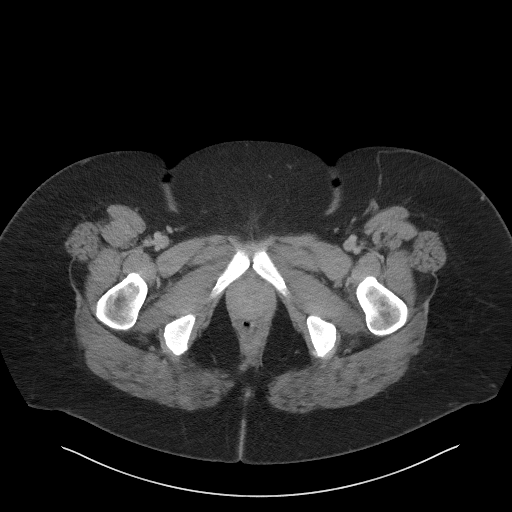
[im 21/101  soft-tissue]
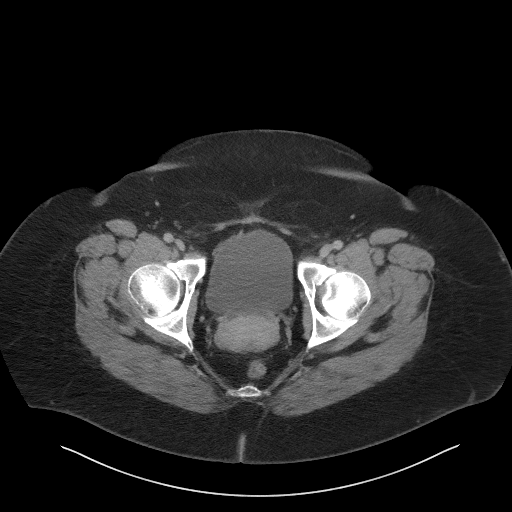
[im 26/101  soft-tissue]
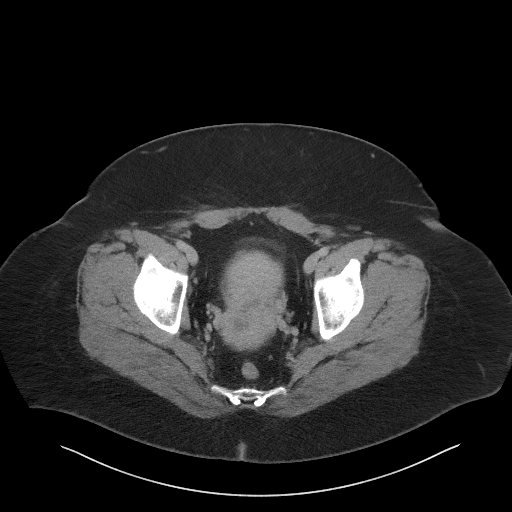
[im 34/101  soft-tissue]
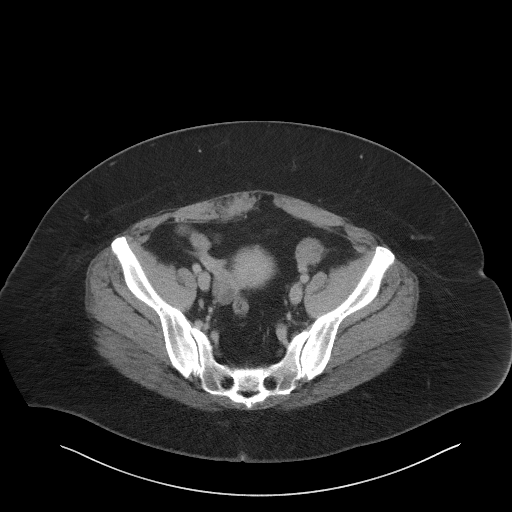
[im 42/101  soft-tissue]
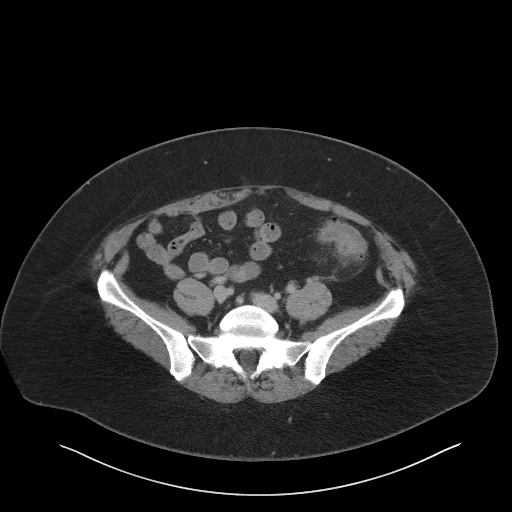
[im 46/101  soft-tissue]
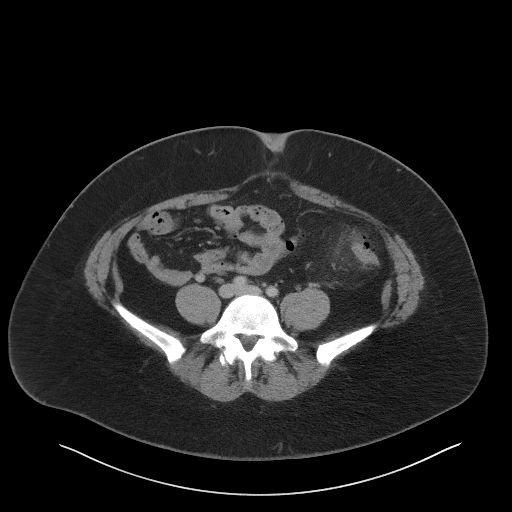
[im 55/101  soft-tissue]
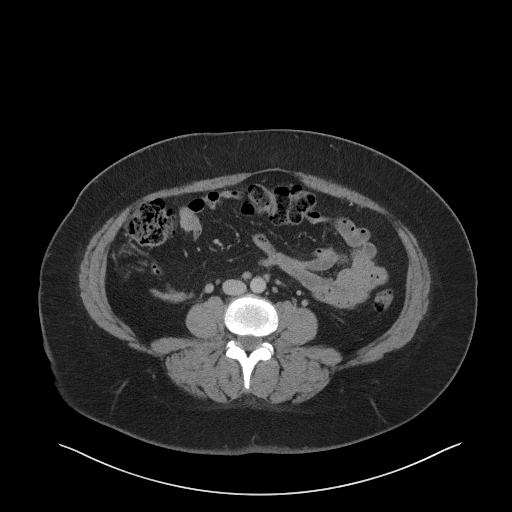
[im 59/101  soft-tissue]
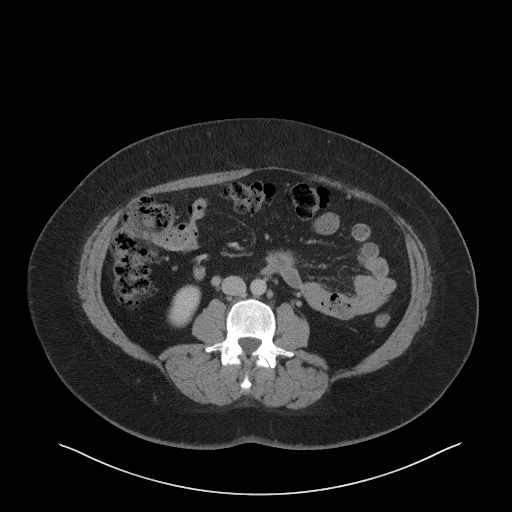
[im 59/101  bone]
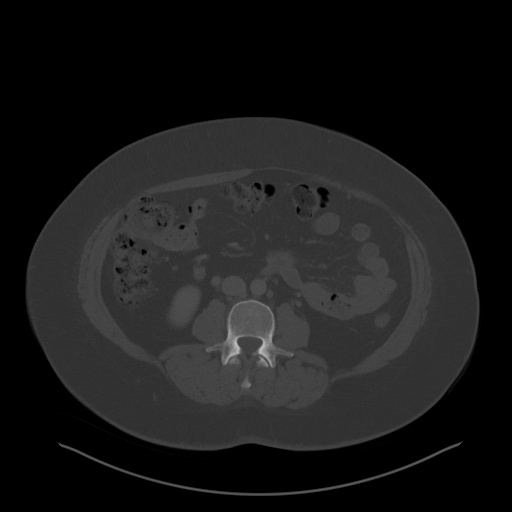
[im 67/101  soft-tissue]
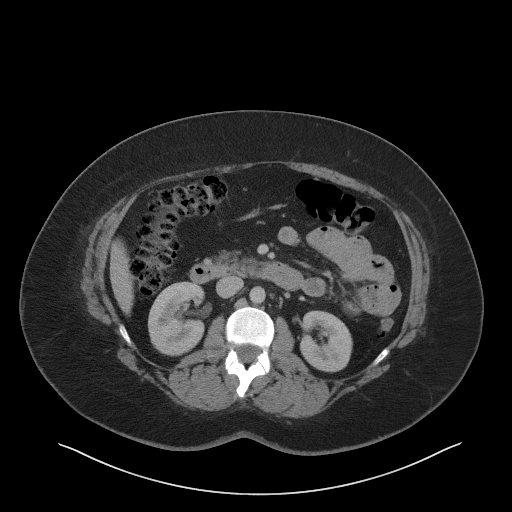
[im 76/101  soft-tissue]
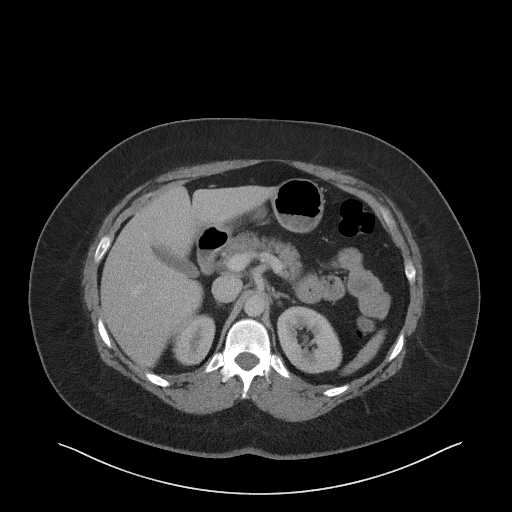
[im 80/101  soft-tissue]
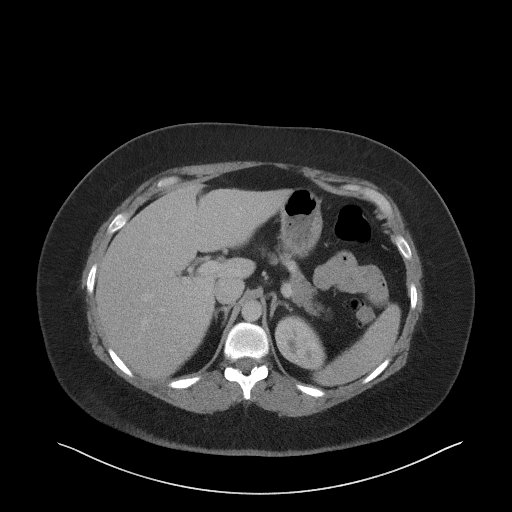
[im 88/101  soft-tissue]
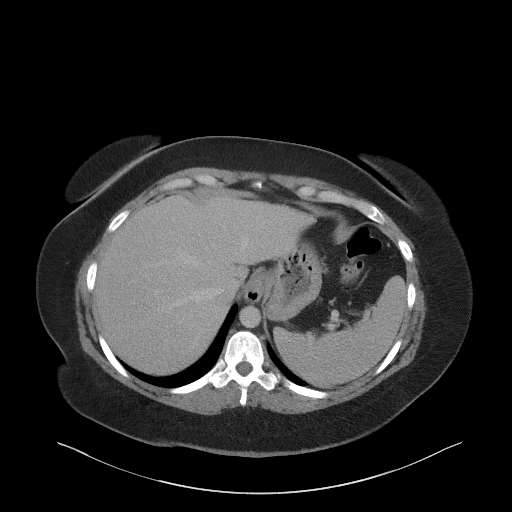
[im 96/101  soft-tissue]
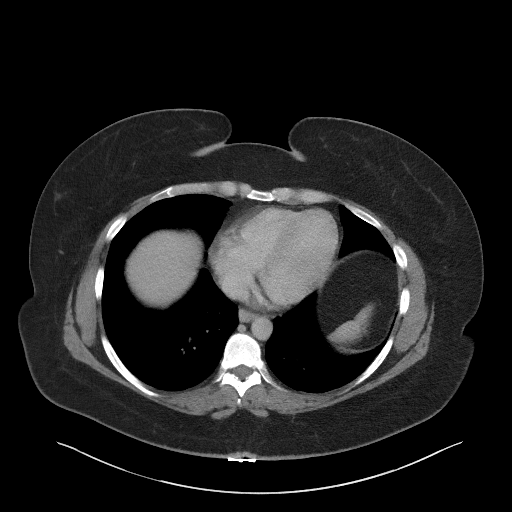

[Series 5: coronal st · coronal · 0.97mm/px · 3 of 103 slices shown]
[im 35/103  soft-tissue]
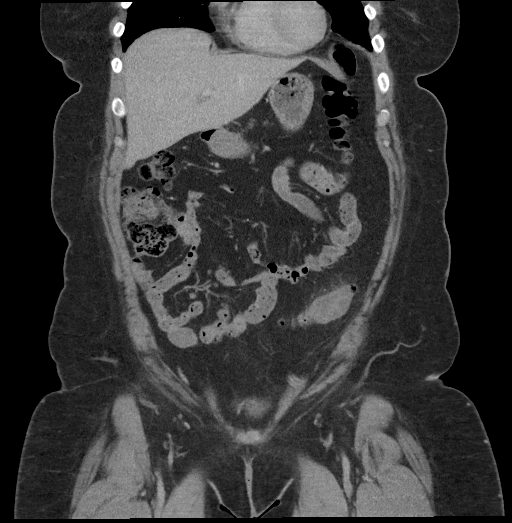
[im 46/103  soft-tissue]
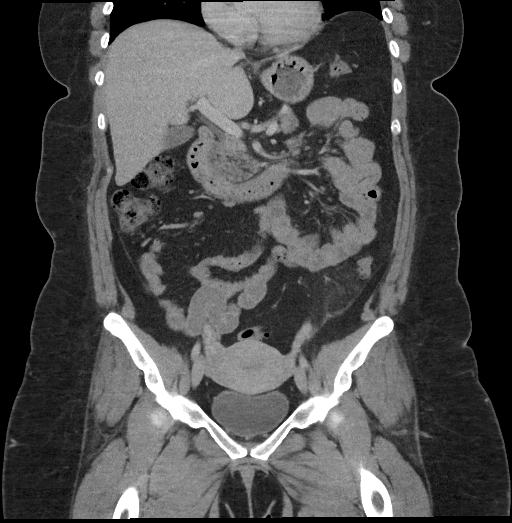
[im 57/103  soft-tissue]
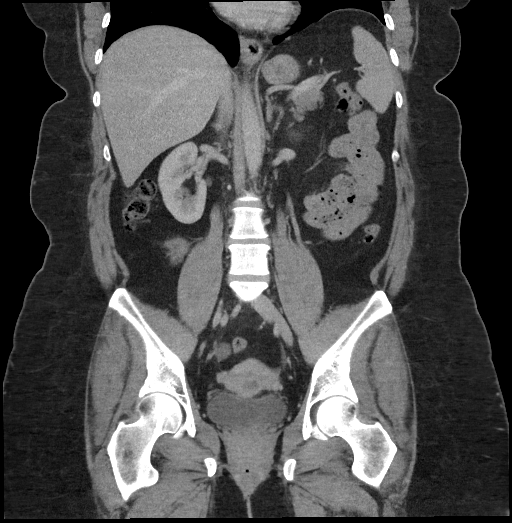

[17 of 46 positions shown; findings below may reference images not displayed]

FINDINGS: Lower chest: No acute abnormality.

Hepatobiliary: No focal liver abnormality is seen. No gallstones,
gallbladder wall thickening, or biliary dilatation.

Pancreas: Unremarkable. No pancreatic ductal dilatation or
surrounding inflammatory changes.

Spleen: Normal in size without focal abnormality.

Adrenals/Urinary Tract: Adrenal glands are unremarkable. Kidneys are
normal, without renal calculi, focal lesion, or hydronephrosis.
Bladder is unremarkable.

Stomach/Bowel: Acute sigmoid diverticulitis (series 5, image 42). No
findings of perforation or abscess. No obstructive or inflammatory
changes of stomach or small bowel. Normal appendix.

Vascular/Lymphatic: No significant vascular findings are present. No
enlarged abdominal or pelvic lymph nodes.

Reproductive: No acute process. Nabothian cysts noted.

Other: No abdominal wall hernia or abnormality. No abdominopelvic
ascites.

Musculoskeletal: No acute or significant osseous findings.
IMPRESSION: Acute sigmoid diverticulitis. No perforation or abscess.

## 2020-07-02 ENCOUNTER — Ambulatory Visit: Payer: BC Managed Care – PPO | Admitting: Dermatology

## 2020-09-03 ENCOUNTER — Ambulatory Visit: Payer: Self-pay | Admitting: Dermatology

## 2020-10-03 ENCOUNTER — Other Ambulatory Visit: Payer: Self-pay | Admitting: Family Medicine

## 2020-10-03 ENCOUNTER — Other Ambulatory Visit: Payer: Self-pay

## 2020-10-03 ENCOUNTER — Ambulatory Visit
Admission: RE | Admit: 2020-10-03 | Discharge: 2020-10-03 | Disposition: A | Discharge status: Home / Self Care | Payer: BC Managed Care – PPO | Source: Ambulatory Visit | Attending: Family Medicine | Admitting: Family Medicine

## 2020-10-03 DIAGNOSIS — I8312 Varicose veins of left lower extremity with inflammation: Secondary | ICD-10-CM | POA: Insufficient documentation

## 2020-10-03 DIAGNOSIS — M79605 Pain in left leg: Secondary | ICD-10-CM

## 2021-01-16 ENCOUNTER — Ambulatory Visit (INDEPENDENT_AMBULATORY_CARE_PROVIDER_SITE_OTHER): Payer: BC Managed Care – PPO | Admitting: Dermatology

## 2021-01-16 ENCOUNTER — Other Ambulatory Visit: Payer: Self-pay

## 2021-01-16 DIAGNOSIS — L659 Nonscarring hair loss, unspecified: Secondary | ICD-10-CM | POA: Diagnosis not present

## 2021-01-16 MED ORDER — SPIRONOLACTONE 100 MG PO TABS: 100.0000 mg | ORAL_TABLET | Freq: Every day | ORAL | 3 refills | Status: DC

## 2021-01-16 MED ORDER — KETOCONAZOLE 2 % EX SHAM: 1.0000 "application " | MEDICATED_SHAMPOO | CUTANEOUS | 0 refills | Status: DC

## 2021-01-16 NOTE — Progress Notes (Signed)
   New Patient Visit  Subjective  Rhonda Gillespie is a 35 y.o. female who presents for the following: Other (Hair thinning and loss over the last few years. Does not take any medications, has not been sick or in the hospital. Had Covid but hair loss started before. She has not had her thyroid checked lately).  Father was bald.  She did experience a lot of hair loss after pregnancies, and it didn't grow back as thick as before. No rash or itching of scalp.    The following portions of the chart were reviewed this encounter and updated as appropriate:        Review of Systems:  No other skin or systemic complaints except as noted in HPI or Assessment and Plan.  Objective  Well appearing patient in no apparent distress; mood and affect are within normal limits.  A focused examination was performed including scalp. Relevant physical exam findings are noted in the Assessment and Plan.  Scalp Diffuse thinning of the crown and widening of the midline part with retention of the frontal hairline - Reviewed progressive nature and prognosis.           Assessment & Plan  Alopecia Scalp  Discussed conditions and treatment options. 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 Will check labs - thyroid, ferritin and vitamin d. Chronic condition related to genetics and hormonal changes associated with menopause causing hair thinning primarily on the crown with widening of the part and temporal hairline recession.  Can try OTC Rogaine (minoxidil) 5% solution/foam as directed. Instructions given.  BP today - 124/87  Start Spironolactone 100 mg 1 po qd, Ketoconazole 2% shampoo 3-4 times per week  Start Rogaine 5% qhs  Spironolactone can cause increased urination and cause blood pressure to decrease. Please watch for signs of lightheadedness and be cautious when changing position. It can sometimes cause breast tenderness or an irregular period in premenopausal women. It can also increase  potassium. The increase in potassium usually is not a concern unless you are taking other medicines that also increase potassium, so please be sure your doctor knows all of the other medications you are taking. This medication should not be taken by pregnant women.  This medicine should also not be taken together with sulfa drugs like Bactrim (trimethoprim/sulfamethexazole).    Thyroid Panel With TSH - Scalp  Ferritin - Scalp  Vitamin D, 25-hydroxy - Scalp  ketoconazole (NIZORAL) 2 % shampoo - Scalp Apply 1 application topically as directed. 3-4 times per week  spironolactone (ALDACTONE) 100 MG tablet - Scalp Take 1 tablet (100 mg total) by mouth daily.  Return in about 3 months (around 04/18/2021).  I, Joanie Coddington, CMA, am acting as scribe for Willeen Niece, MD .  Documentation: I have reviewed the above documentation for accuracy and completeness, and I agree with the above.  Willeen Niece MD

## 2021-01-16 NOTE — Patient Instructions (Addendum)
Chronic condition related to genetics and hormonal changes associated with menopause causing hair thinning primarily on the crown with widening of the part and temporal hairline recession.  Can try OTC Rogaine (minoxidil) 5% solution/foam as directed. Instructions given.   Spironolactone can cause increased urination and cause blood pressure to decrease. Please watch for signs of lightheadedness and be cautious when changing position. It can sometimes cause breast tenderness or an irregular period in premenopausal women. It can also increase potassium. The increase in potassium usually is not a concern unless you are taking other medicines that also increase potassium, so please be sure your doctor knows all of the other medications you are taking. This medication should not be taken by pregnant women.  This medicine should also not be taken together with sulfa drugs like Bactrim (trimethoprim/sulfamethexazole).    Recommend minoxidil 5% (Rogaine for men) solution or foam to be applied to the scalp and left in. This should ideally be used twice daily for best results but it helps with hair regrowth when used at least three times per week. Rogaine initially can cause increased hair shedding for the first few weeks but this will stop with continued use. In studies, people who used minoxidil (Rogaine) for at least 6 months had thicker hair than people who did not. Minoxidil topical (Rogaine) only works as long as it continues to be used. If if it is no longer used then the hair it has been helping to regrow can fall out. Minoxidil topical (Rogaine) can cause increased facial hair growth which can usually be managed easily with a battery-operated hair trimmer. If facial hair growth is bothersome, switching to the 2% women's version can decrease the risk of unwanted facial hair growth.     If you have any questions or concerns for your doctor, please call our main line at [phone] and press option 4 to reach  your doctor's medical assistant. If no one answers, please leave a voicemail as directed and we will return your call as soon as possible. Messages left after 4 pm will be answered the following business day.   You may also send [place] a message via MyChart. We typically respond to MyChart messages within 1-2 business days.  For prescription refills, please ask your pharmacy to contact our office. Our fax number is [phone].  If you have an urgent issue when the clinic is closed that cannot wait until the next business day, you can page your doctor at the number below.    Please note that while we do our best to be available for urgent issues outside of office hours, we are not available 24/7.   If you have an urgent issue and are unable to reach [place], you may choose to seek medical care at your doctor's office, retail clinic, urgent care center, or emergency room.  If you have a medical emergency, please immediately call 911 or go to the emergency department.  Pager Numbers  - Dr. Gwen Pounds: [phone]  - Dr. Neale Burly: [phone]  - Dr. Roseanne Reno: [phone]  In the event of inclement weather, please call our main line at [phone] for an update on the status of any delays or closures.  Dermatology Medication Tips: Please keep the boxes that topical medications come in in order to help keep track of the instructions about where and how to use these. Pharmacies typically print the medication instructions only on the boxes and not directly on the medication tubes.   If your medication is too  expensive, please contact our office at [phone] option 4 or send [place] a message through MyChart.   We are unable to tell what your co-pay for medications will be in advance as this is different depending on your insurance coverage. However, we may be able to find a substitute medication at lower cost or fill out paperwork to get insurance to cover a needed medication.   If a prior authorization  is required to get your medication covered by your insurance company, please allow [place] 1-2 business days to complete this process.  Drug prices often vary depending on where the prescription is filled and some pharmacies may offer cheaper prices.  The website www.goodrx.com contains coupons for medications through different pharmacies. The prices here do not account for what the cost may be with help from insurance (it may be cheaper with your insurance), but the website can give you the price if you did not use any insurance.  - You can print the associated coupon and take it with your prescription to the pharmacy.  - You may also stop by our office during regular business hours and pick up a GoodRx coupon card.  - If you need your prescription sent electronically to a different pharmacy, notify our office through [facility] or by phone at [phone] option 4.

## 2021-02-12 ENCOUNTER — Other Ambulatory Visit: Payer: Self-pay | Admitting: Dermatology

## 2021-02-12 DIAGNOSIS — L659 Nonscarring hair loss, unspecified: Secondary | ICD-10-CM

## 2021-03-19 ENCOUNTER — Other Ambulatory Visit: Payer: Self-pay | Admitting: Dermatology

## 2021-03-19 DIAGNOSIS — L659 Nonscarring hair loss, unspecified: Secondary | ICD-10-CM

## 2021-04-12 ENCOUNTER — Other Ambulatory Visit: Payer: Self-pay | Admitting: Dermatology

## 2021-04-12 DIAGNOSIS — L659 Nonscarring hair loss, unspecified: Secondary | ICD-10-CM

## 2021-04-21 ENCOUNTER — Ambulatory Visit: Payer: BC Managed Care – PPO | Admitting: Dermatology

## 2021-04-21 ENCOUNTER — Other Ambulatory Visit: Payer: Self-pay | Admitting: Dermatology

## 2021-04-21 DIAGNOSIS — L659 Nonscarring hair loss, unspecified: Secondary | ICD-10-CM

## 2021-04-28 ENCOUNTER — Ambulatory Visit: Payer: BC Managed Care – PPO | Admitting: Dermatology

## 2021-05-12 ENCOUNTER — Other Ambulatory Visit: Payer: Self-pay | Admitting: Dermatology

## 2021-05-12 DIAGNOSIS — L659 Nonscarring hair loss, unspecified: Secondary | ICD-10-CM

## 2021-06-09 ENCOUNTER — Other Ambulatory Visit: Payer: Self-pay | Admitting: Dermatology

## 2021-06-09 DIAGNOSIS — L659 Nonscarring hair loss, unspecified: Secondary | ICD-10-CM

## 2023-07-23 ENCOUNTER — Observation Stay
Admission: EM | Admit: 2023-07-23 | Discharge: 2023-07-24 | Disposition: A | Discharge status: Home / Self Care | Payer: Self-pay | Attending: Internal Medicine | Admitting: Internal Medicine

## 2023-07-23 ENCOUNTER — Emergency Department: Payer: Self-pay

## 2023-07-23 ENCOUNTER — Other Ambulatory Visit: Payer: Self-pay

## 2023-07-23 DIAGNOSIS — R109 Unspecified abdominal pain: Secondary | ICD-10-CM | POA: Diagnosis present

## 2023-07-23 DIAGNOSIS — K5900 Constipation, unspecified: Secondary | ICD-10-CM | POA: Diagnosis not present

## 2023-07-23 DIAGNOSIS — R079 Chest pain, unspecified: Secondary | ICD-10-CM | POA: Insufficient documentation

## 2023-07-23 DIAGNOSIS — R748 Abnormal levels of other serum enzymes: Secondary | ICD-10-CM | POA: Insufficient documentation

## 2023-07-23 DIAGNOSIS — R7401 Elevation of levels of liver transaminase levels: Secondary | ICD-10-CM | POA: Diagnosis not present

## 2023-07-23 DIAGNOSIS — Z6839 Body mass index (BMI) 39.0-39.9, adult: Secondary | ICD-10-CM

## 2023-07-23 DIAGNOSIS — K8050 Calculus of bile duct without cholangitis or cholecystitis without obstruction: Principal | ICD-10-CM | POA: Diagnosis present

## 2023-07-23 DIAGNOSIS — R8271 Bacteriuria: Secondary | ICD-10-CM | POA: Insufficient documentation

## 2023-07-23 DIAGNOSIS — E669 Obesity, unspecified: Secondary | ICD-10-CM | POA: Diagnosis present

## 2023-07-23 HISTORY — DX: Calculus of bile duct without cholangitis or cholecystitis without obstruction: K80.50

## 2023-07-23 LAB — COMPREHENSIVE METABOLIC PANEL
ALT: 544 U/L — ABNORMAL HIGH (ref 0–44)
AST: 632 U/L — ABNORMAL HIGH (ref 15–41)
Albumin: 3.7 g/dL (ref 3.5–5.0)
Alkaline Phosphatase: 102 U/L (ref 38–126)
Anion gap: 7 (ref 5–15)
BUN: 11 mg/dL (ref 6–20)
CO2: 24 mmol/L (ref 22–32)
Calcium: 8.8 mg/dL — ABNORMAL LOW (ref 8.9–10.3)
Chloride: 106 mmol/L (ref 98–111)
Creatinine, Ser: 0.74 mg/dL (ref 0.44–1.00)
GFR, Estimated: >60 mL/min (ref >60)
Glucose, Bld: 93 mg/dL (ref 70–99)
Potassium: 3.9 mmol/L (ref 3.5–5.1)
Sodium: 137 mmol/L (ref 135–145)
Total Bilirubin: 2.8 mg/dL — ABNORMAL HIGH (ref 0.0–1.2)
Total Protein: 7 g/dL (ref 6.5–8.1)

## 2023-07-23 LAB — URINALYSIS, ROUTINE W REFLEX MICROSCOPIC
Bilirubin Urine: NEGATIVE
Glucose, UA: NEGATIVE mg/dL
Ketones, ur: NEGATIVE mg/dL
Leukocytes,Ua: NEGATIVE
Nitrite: NEGATIVE
Protein, ur: NEGATIVE mg/dL
Specific Gravity, Urine: 1.004 — ABNORMAL LOW (ref 1.005–1.030)
pH: 6 (ref 5.0–8.0)

## 2023-07-23 LAB — CBC
HCT: 42.6 % (ref 36.0–46.0)
Hemoglobin: 14.2 g/dL (ref 12.0–15.0)
MCH: 30.5 pg (ref 26.0–34.0)
MCHC: 33.3 g/dL (ref 30.0–36.0)
MCV: 91.6 fL (ref 80.0–100.0)
Platelets: 258 10*3/uL (ref 150–400)
RBC: 4.65 MIL/uL (ref 3.87–5.11)
RDW: 12.7 % (ref 11.5–15.5)
WBC: 5.2 10*3/uL (ref 4.0–10.5)
nRBC: 0 % (ref 0.0–0.2)

## 2023-07-23 LAB — PREGNANCY, URINE: Preg Test, Ur: NEGATIVE

## 2023-07-23 LAB — LIPASE, BLOOD: Lipase: 37 U/L (ref 11–51)

## 2023-07-23 MED ORDER — ACETAMINOPHEN 650 MG RE SUPP: 650.0000 mg | Freq: Four times a day (QID) | RECTAL | Status: DC | PRN

## 2023-07-23 MED ORDER — METRONIDAZOLE 500 MG/100ML IV SOLN
500.0000 mg | Freq: Two times a day (BID) | INTRAVENOUS | Status: DC
  Administered 2023-07-23 – 2023-07-24 (×2): 500 mg via INTRAVENOUS
  Filled 2023-07-23 (×2): qty 100

## 2023-07-23 MED ORDER — SODIUM CHLORIDE 0.9 % IV BOLUS
500.0000 mL | Freq: Once | INTRAVENOUS | Status: AC
  Administered 2023-07-23: 500 mL via INTRAVENOUS

## 2023-07-23 MED ORDER — ACETAMINOPHEN 325 MG PO TABS: 650.0000 mg | ORAL_TABLET | Freq: Four times a day (QID) | ORAL | Status: DC | PRN

## 2023-07-23 MED ORDER — FENTANYL CITRATE PF 50 MCG/ML IJ SOSY: 25.0000 ug | PREFILLED_SYRINGE | INTRAMUSCULAR | Status: AC | PRN

## 2023-07-23 MED ORDER — KETOROLAC TROMETHAMINE 15 MG/ML IJ SOLN
15.0000 mg | Freq: Once | INTRAMUSCULAR | Status: AC
  Administered 2023-07-23: 15 mg via INTRAMUSCULAR
  Filled 2023-07-23: qty 1

## 2023-07-23 MED ORDER — MORPHINE SULFATE (PF) 2 MG/ML IV SOLN: 2.0000 mg | INTRAVENOUS | Status: AC | PRN

## 2023-07-23 MED ORDER — SODIUM CHLORIDE 0.9 % IV SOLN
2.0000 g | INTRAVENOUS | Status: DC
  Administered 2023-07-23: 2 g via INTRAVENOUS
  Filled 2023-07-23: qty 20

## 2023-07-23 MED ORDER — PANTOPRAZOLE SODIUM 40 MG IV SOLR
40.0000 mg | Freq: Two times a day (BID) | INTRAVENOUS | Status: DC | PRN
  Administered 2023-07-23: 40 mg via INTRAVENOUS
  Filled 2023-07-23: qty 10

## 2023-07-23 MED ORDER — MORPHINE SULFATE (PF) 4 MG/ML IV SOLN: 4.0000 mg | INTRAVENOUS | Status: AC | PRN

## 2023-07-23 MED ORDER — LACTATED RINGERS IV SOLN: INTRAVENOUS | Status: AC

## 2023-07-23 MED ORDER — POLYETHYLENE GLYCOL 3350 17 G PO PACK: 17.0000 g | PACK | Freq: Two times a day (BID) | ORAL | Status: DC | PRN

## 2023-07-23 MED ORDER — ONDANSETRON HCL 4 MG PO TABS: 4.0000 mg | ORAL_TABLET | Freq: Four times a day (QID) | ORAL | Status: DC | PRN

## 2023-07-23 MED ORDER — SENNOSIDES-DOCUSATE SODIUM 8.6-50 MG PO TABS
1.0000 | ORAL_TABLET | Freq: Two times a day (BID) | ORAL | Status: DC
  Filled 2023-07-23: qty 1

## 2023-07-23 MED ORDER — SIMETHICONE 80 MG PO CHEW
80.0000 mg | CHEWABLE_TABLET | Freq: Four times a day (QID) | ORAL | Status: DC | PRN
  Filled 2023-07-23: qty 1

## 2023-07-23 MED ORDER — ONDANSETRON HCL 4 MG/2ML IJ SOLN: 4.0000 mg | Freq: Four times a day (QID) | INTRAMUSCULAR | Status: DC | PRN

## 2023-07-23 MED ORDER — SENNOSIDES-DOCUSATE SODIUM 8.6-50 MG PO TABS: 1.0000 | ORAL_TABLET | Freq: Every evening | ORAL | Status: DC | PRN

## 2023-07-23 NOTE — ED Notes (Addendum)
 This RN to bedside to introduce self to pt. Pt is caox4, in no acute distress and spouse at bedside. Pt still doesn't know if she is getting surgery or not. Surgeon came by and advised GI would be by later. Pt has orders for cardiac monitoring. Pt is not currently on the monitor. This RN placed pt on monitor and called CCMD.

## 2023-07-23 NOTE — Assessment & Plan Note (Addendum)
 Suspect secondary to CBD stone Recheck liver enzymes in the a.m.

## 2023-07-23 NOTE — Assessment & Plan Note (Signed)
 With possible increased risk of worsening constipation in setting of IV pain medication requirement Senna docusate 1 tablet twice daily, scheduled MiraLAX/GlycoLax twice daily as needed for moderate constipation, 5 days ordered

## 2023-07-23 NOTE — Assessment & Plan Note (Signed)
 Versus morbid obesity as patient is mildly hypertensive in the ED This complicates overall care and prognosis.

## 2023-07-23 NOTE — ED Triage Notes (Signed)
 Pt comes with c/o abdominal pain. Pt states this all started last night. Pt states she went to [facility] and was sent here to rule out gallbladder. Pt states RUQ pain and some back pain.

## 2023-07-23 NOTE — Hospital Course (Signed)
 Ms. Rhonda Gillespie is a 38 year old female with history of obesity, who presents to the emergency department for chief concerns of abdominal pain.  Vitals in the ED showed temperature of 98.2, respiration rate of 18, heart rate 75, blood pressure 133/90, SpO2 99% on room air.  Serum sodium is 137, potassium 3.9, chloride 106, bicarb 24, BUN of 11, serum creatinine 0.74, EGFR greater than 60, nonfasting blood glucose 93, WBC 5.2, hemoglobin 14.2, platelets of 258.  UA was negative for leukocytes and nitrates pending  ED treatment: Ketorolac  15 mg IM one-time dose, sodium chloride  500 mL liter bolus.  EDP consulted GI specialist on-call who states that ERCP provider will be available this weekend to perform ERCP if indicated.  EDP also consulted general surgery who is aware of the patient.

## 2023-07-23 NOTE — Assessment & Plan Note (Signed)
 -  This complicates overall care and prognosis.

## 2023-07-23 NOTE — Consult Note (Signed)
 Subjective:   CC: choledocolithiasis  HPI:  Rhonda Gillespie is a 38 y.o. female who is consulted by [facility] for evaluation of above cc.  Symptoms were first noted 1 day ago. Pain is sharp, RUQ, radiating to back.  Associated with nausea/vomiting, exacerbated by nothing specific.  Also complains of constipation. Now pain is better.     Past Medical History:  has a past medical history of Migraine headache.  Past Surgical History:  has a past surgical history that includes Cesarean section (2011,2012,2014) and Tubal ligation (2014).  Family History: family history includes Cancer in her paternal grandfather; Depression in her father, maternal grandfather, and mother; Diabetes in her father and maternal grandmother; Heart disease in her maternal grandmother.  Social History:  reports that she has never smoked. She has never used smokeless tobacco. She reports that she does not drink alcohol and does not use drugs.  Current Medications:  Prior to Admission medications   Medication Sig Start Date End Date Taking? Authorizing Provider  ketoconazole  (NIZORAL ) 2 % shampoo APPLY 1 APPLICATION TOPICALLY AS DIRECTED. 3-4 TIMES PER WEEK 04/21/21   Jackquline Sawyer, MD  spironolactone  (ALDACTONE ) 100 MG tablet TAKE 1 TABLET BY MOUTH EVERY DAY 05/12/21   Jackquline Sawyer, MD    Allergies:  Allergies as of 07/23/2023 - Review Complete 07/23/2023  Allergen Reaction Noted   Amoxicillin Hives 06/19/2015    ROS:  General: Denies weight loss, weight gain, fatigue, fevers, chills, and night sweats. Eyes: Denies blurry vision, double vision, eye pain, itchy eyes, and tearing. Ears: Denies hearing loss, earache, and ringing in ears. Nose: Denies sinus pain, congestion, infections, runny nose, and nosebleeds. Mouth/throat: Denies hoarseness, sore throat, bleeding gums, and difficulty swallowing. Heart: Denies chest pain, palpitations, racing heart, irregular heartbeat, leg pain or swelling, and decreased  activity tolerance. Respiratory: Denies breathing difficulty, shortness of breath, wheezing, cough, and sputum. GI: Denies change in appetite, heartburn, diarrhea, and blood in stool. GU: Denies difficulty urinating, pain with urinating, urgency, frequency, blood in urine. Musculoskeletal: Denies joint stiffness, pain, swelling, muscle weakness. Skin: Denies rash, itching, mass, tumors, sores, and boils Neurologic: Denies headache, fainting, dizziness, seizures, numbness, and tingling. Psychiatric: Denies depression, anxiety, difficulty sleeping, and memory loss. Endocrine: Denies heat or cold intolerance, and increased thirst or urination. Blood/lymph: Denies easy bruising, and swollen glands     Objective:     BP 130/88 (BP Location: Left Arm)   Pulse 70   Temp 98 F (36.7 C) (Oral)   Resp 18   Ht 5' 3 (1.6 m)   Wt 90.7 kg   SpO2 99%   BMI 35.43 kg/m    Constitutional :  alert, cooperative, appears stated age, and no distress  Lymphatics/Throat:  no asymmetry, masses, or scars  Respiratory:  clear to auscultation bilaterally  Cardiovascular:  regular rate and rhythm  Gastrointestinal: soft, non-tender; bowel sounds normal; no masses,  no organomegaly.   Musculoskeletal: Steady movement  Skin: Cool and moist  Psychiatric: Normal affect, non-agitated, not confused       LABS:     Latest Ref Rng & Units 07/23/2023    9:50 AM 08/02/2018    5:27 PM  CMP  Glucose 70 - 99 mg/dL 93  892   BUN 6 - 20 mg/dL 11  9   Creatinine 9.55 - 1.00 mg/dL 9.25  9.29   Sodium 864 - 145 mmol/L 137  137   Potassium 3.5 - 5.1 mmol/L 3.9  3.7   Chloride 98 -  111 mmol/L 106  101   CO2 22 - 32 mmol/L 24  29   Calcium 8.9 - 10.3 mg/dL 8.8  8.9   Total Protein 6.5 - 8.1 g/dL 7.0  7.7   Total Bilirubin 0.0 - 1.2 mg/dL 2.8  0.8   Alkaline Phos 38 - 126 U/L 102  76   AST 15 - 41 U/L 632  19   ALT 0 - 44 U/L 544  21       Latest Ref Rng & Units 07/23/2023    9:50 AM 08/02/2018    5:27 PM  10/11/2012    5:21 AM  CBC  WBC 4.0 - 10.5 K/uL 5.2  10.8    Hemoglobin 12.0 - 15.0 g/dL 85.7  85.6    Hematocrit 36.0 - 46.0 % 42.6  43.4  31.9   Platelets 150 - 400 K/uL 258  309       RADS: CLINICAL DATA:  Right upper quadrant abdominal pain. Cholelithiasis.   EXAM: MRI ABDOMEN WITHOUT CONTRAST  (INCLUDING MRCP)   TECHNIQUE: Multiplanar multisequence MR imaging of the abdomen was performed. Heavily T2-weighted images of the biliary and pancreatic ducts were obtained, and three-dimensional MRCP images were rendered by post processing.   COMPARISON:  Right upper quadrant abdominal sonogram for today. 08/24/2018 CT abdomen/pelvis.   FINDINGS: Lower chest: No acute abnormality at the lung bases.   Hepatobiliary: Normal liver size and configuration. No hepatic steatosis. No liver mass. Layering small gallstones and small amount of layering sludge in the nondistended gallbladder, largest gallstone 4 mm. No gallbladder wall thickening or pericholecystic fluid. Mild central intrahepatic biliary ductal dilatation and mildly dilated common bile duct with diameter 8 mm. Two small indistinct filling defects in the lower third of the common bile duct on the 3D MRCP sequence measuring 4 mm (series 14/image 45) and 3 mm (series 14/image 47), which may represent small stones.   Pancreas: No pancreatic mass or duct dilation.  No pancreas divisum.   Spleen: Normal size. No mass.   Adrenals/Urinary Tract: Normal adrenals. No hydronephrosis. Normal kidneys with no renal mass.   Stomach/Bowel: Normal non-distended stomach. Visualized small and large bowel is normal caliber, with no bowel wall thickening. Moderate left colonic diverticulosis.   Vascular/Lymphatic: Normal caliber abdominal aorta. No pathologically enlarged lymph nodes in the abdomen.   Other: No abdominal ascites or focal fluid collection.   Musculoskeletal: No aggressive appearing focal osseous lesions.    IMPRESSION: 1. Cholelithiasis. No MRI evidence of acute cholecystitis. 2. Mild central intrahepatic biliary ductal dilatation and mildly dilated common bile duct with CBD diameter 8 mm. Two small indistinct filling defects in the lower third of the common bile duct on the 3D MRCP sequence measuring 4 mm and 3 mm, which may represent small stones/choledocholithiasis. 3. Moderate left colonic diverticulosis.     Electronically Signed   By: Selinda DELENA Blue M.D.   On: 07/23/2023 13:02  CLINICAL DATA:  Right upper quadrant abdominal pain.   EXAM: ULTRASOUND ABDOMEN LIMITED RIGHT UPPER QUADRANT   COMPARISON:  None Available.   FINDINGS: Gallbladder:   The gallbladder is physiologically distended. Small volume gallstones/sludge noted. No abnormal wall thickening or pericholecystic free fluid. Sonographic Murphy sign was negative as per technologist.   Common bile duct:   Diameter: Up to 5.3 mm.  No intrahepatic bile duct dilation.   Liver:   No focal lesion identified. Within normal limits in parenchymal echogenicity. Portal vein is patent on color Doppler imaging with normal direction  of blood flow towards the liver.   Other: None.   IMPRESSION: *Small volume cholelithiasis/sludge without imaging signs of acute cholecystitis.     Electronically Signed   By: Ree Molt M.D.   On: 07/23/2023 10:54 Assessment:      Choledocolithiasis- recommend robotic lap chole after ERCP to prevent future recurrence.  Plan:      Discussed the risk of surgery including post-op infxn, seroma, biloma, chronic pain, poor-delayed wound healing, retained gallstone, conversion to open procedure, post-op SBO or ileus, and need for additional procedures to address said risks.  The risks of general anesthetic including MI, CVA, sudden death or even reaction to anesthetic medications also discussed. Alternatives include continued observation.  Benefits include possible symptom relief,  prevention of complications including acute cholecystitis, pancreatitis.  Typical post operative recovery of 3-5 days rest, continued pain in area and incision sites, possible loose stools up to 4-6 weeks, also discussed.  The patient understands the risks, any and all questions were answered to the patient's satisfaction.  Further care per GI and hospitalist in the meantime.  Will schedule after ERCP.  labs/images/medications/previous chart entries reviewed personally and relevant changes/updates noted above.

## 2023-07-23 NOTE — Assessment & Plan Note (Signed)
 Ceftriaxone  2 g IV daily, metronidazole  500 mg IV twice daily ordered on admission Status post sodium chloride  500 mL liter bolus per EDP Continue with LR infusion at 125 mL/h, 1 day ordered Symptomatic support: Morphine  2 mg IV every 4 hours as needed for moderate pain, 1 day ordered; morphine  4 mg IV every 4 hours as needed for severe pain, 1 day ordered; fentanyl  25 mcg IV every 4 hours as needed for severe pain, 20 hours of coverage ordered AM team to reevaluate patient at bedside for continued IV opioid pain requirements

## 2023-07-23 NOTE — H&P (Signed)
 History and Physical   Rhonda Gillespie FMW:[id number] DOB: 03/03/1986 DOA: 07/23/2023  PCP: Cleotilde Oneil FALCON, MD  Patient coming from: Outpatient [facility]  I have personally briefly reviewed patient's old medical records in [facility] EMR.  Chief Concern: Right upper quadrant abdominal pain, and back pain  HPI: Rhonda Gillespie is a 38 year old female with history of obesity, who presents to the emergency department for chief concerns of abdominal pain.  Vitals in the ED showed temperature of 98.2, respiration rate of 18, heart rate 75, blood pressure 133/90, SpO2 99% on room air.  Serum sodium is 137, potassium 3.9, chloride 106, bicarb 24, BUN of 11, serum creatinine 0.74, EGFR greater than 60, nonfasting blood glucose 93, WBC 5.2, hemoglobin 14.2, platelets of 258.  UA was negative for leukocytes and nitrates pending  ED treatment: Ketorolac  15 mg IM one-time dose, sodium chloride  500 mL liter bolus.  EDP consulted GI specialist on-call who states that ERCP provider will be available this weekend to perform ERCP if indicated.  EDP also consulted general surgery who is aware of the patient. ------------------------------------------ At bedside, patient is able to tell me her name, age, current calendar year, current lcoation.  She reprots this pain started last evening, laying in bed, approxiamtely 9:30p at night.  She denies trauma to her person. She has been having pain over the last few years, worse with eating and it has never been like this before.   She denies fever, chills, nausea, vomiting, dysuria, hematuria, diarrhea, blood in stool. Last BM was about two hours ago, and it was constipation.   Social history: She lives with her husband at home. She denies tobacco, recreational drug use. Last etoh was New Years eve. She rarely drinks etoh.   ROS: Constitutional: no weight change, no fever ENT/Mouth: no sore throat, no rhinorrhea Eyes: no eye pain, no vision  changes Cardiovascular: no chest pain, no dyspnea,  no edema, no palpitations Respiratory: no cough, no sputum, no wheezing Gastrointestinal: no nausea, no vomiting, no diarrhea, + constipation Genitourinary: no urinary incontinence, no dysuria, no hematuria Musculoskeletal: no arthralgias, no myalgias Skin: no skin lesions, no pruritus, Neuro: no weakness, no loss of consciousness, no syncope Psych: no anxiety, no depression, + decrease appetite Heme/Lymph: no bruising, no bleeding  ED Course: Discussed with EDP, patient requiring hospitalization for chief concerns of CBD stone.  Assessment/Plan  Principal Problem:   Choledocholithiasis Active Problems:   Obese   BMI 39.0-39.9,adult   Bacteria in urine   Elevated liver enzymes   Constipation   Assessment and Plan:  * Choledocholithiasis Ceftriaxone  2 g IV daily, metronidazole  500 mg IV twice daily ordered on admission Status post sodium chloride  500 mL liter bolus per EDP Continue with LR infusion at 125 mL/h, 1 day ordered Symptomatic support: Morphine  2 mg IV every 4 hours as needed for moderate pain, 1 day ordered; morphine  4 mg IV every 4 hours as needed for severe pain, 1 day ordered; fentanyl  25 mcg IV every 4 hours as needed for severe pain, 20 hours of coverage ordered AM team to reevaluate patient at bedside for continued IV opioid pain requirements  Constipation With possible increased risk of worsening constipation in setting of IV pain medication requirement Senna docusate 1 tablet twice daily, scheduled MiraLAX /GlycoLax  twice daily as needed for moderate constipation, 5 days ordered  Elevated liver enzymes Suspect secondary to CBD stone Recheck liver enzymes in the a.m.  Bacteria in urine Asymptomatic, present on admission Patient does  not meet criteria for UTI Given patient has choledocholithiasis, we will continue antibiotic for choledocholithiasis which would cover any UTI/bacteriuria  BMI  39.0-39.9,adult This complicates overall care and prognosis.   Obese Versus morbid obesity as patient is mildly hypertensive in the ED This complicates overall care and prognosis.   Chart reviewed.   DVT prophylaxis: TED hose; AM team to initiate pharmacologic DVT prophylaxis when the benefits outweigh the risk Code Status: full code Diet: Regular diet; n.p.o. after midnight Family Communication: updated spouse with patient's permission. Disposition Plan: Pending clinical course Consults called: General Surgery and gastroenterology services via EDP and epic order Admission status: Telemetry surgical, inpatient  Past Medical History:  Diagnosis Date   Choledocholithiasis    Migraine headache    Past Surgical History:  Procedure Laterality Date   CESAREAN SECTION  2011,2012,2014   x3   TUBAL LIGATION  2014   Social History:  reports that she has never smoked. She has never used smokeless tobacco. She reports that she does not drink alcohol and does not use drugs.  Allergies  Allergen Reactions   Amoxicillin Hives   Family History  Problem Relation Age of Onset   Depression Mother    Depression Father    Diabetes Father    Heart disease Maternal Grandmother    Diabetes Maternal Grandmother    Depression Maternal Grandfather    Cancer Paternal Grandfather    Family history: Family history reviewed and not pertinent.  Prior to Admission medications   Medication Sig Start Date End Date Taking? Authorizing Provider  ketoconazole  (NIZORAL ) 2 % shampoo APPLY 1 APPLICATION TOPICALLY AS DIRECTED. 3-4 TIMES PER WEEK 04/21/21   Jackquline Sawyer, MD  spironolactone  (ALDACTONE ) 100 MG tablet TAKE 1 TABLET BY MOUTH EVERY DAY 05/12/21   Jackquline Sawyer, MD   Physical Exam: Vitals:   07/23/23 0948 07/23/23 1349  BP: (!) 133/90 130/88  Pulse: 75 70  Resp: 18 18  Temp: 98.2 F (36.8 C) 98 F (36.7 C)  TempSrc:  Oral  SpO2: 99% 99%  Weight: 90.7 kg   Height: 5' 3 (1.6 m)     Constitutional: appears age-appropriate, NAD, calm Eyes: PERRL, lids and conjunctivae normal ENMT: Mucous membranes are moist. Posterior pharynx clear of any exudate or lesions. Age-appropriate dentition. Hearing appropriate Neck: normal, supple, no masses, no thyromegaly Respiratory: clear to auscultation bilaterally, no wheezing, no crackles. Normal respiratory effort. No accessory muscle use.  Cardiovascular: Regular rate and rhythm, no murmurs / rubs / gallops. No extremity edema. 2+ pedal pulses. No carotid bruits.  Abdomen: no tenderness, no masses palpated, no hepatosplenomegaly. Bowel sounds positive.  Musculoskeletal: no clubbing / cyanosis. No joint deformity upper and lower extremities. Good ROM, no contractures, no atrophy. Normal muscle tone.  Skin: no rashes, lesions, ulcers. No induration Neurologic: Sensation intact. Strength 5/5 in all 4.  Psychiatric: Normal judgment and insight. Alert and oriented x 3. Normal mood.   EKG: Ordered and pending completion  Chest x-ray on Admission: Not indicated at this time  MR ABDOMEN MRCP WO CONTRAST Result Date: 07/23/2023 CLINICAL DATA:  Right upper quadrant abdominal pain. Cholelithiasis. EXAM: MRI ABDOMEN WITHOUT CONTRAST  (INCLUDING MRCP) TECHNIQUE: Multiplanar multisequence MR imaging of the abdomen was performed. Heavily T2-weighted images of the biliary and pancreatic ducts were obtained, and three-dimensional MRCP images were rendered by post processing. COMPARISON:  Right upper quadrant abdominal sonogram for today. 08/24/2018 CT abdomen/pelvis. FINDINGS: Lower chest: No acute abnormality at the lung bases. Hepatobiliary: Normal liver size and  configuration. No hepatic steatosis. No liver mass. Layering small gallstones and small amount of layering sludge in the nondistended gallbladder, largest gallstone 4 mm. No gallbladder wall thickening or pericholecystic fluid. Mild central intrahepatic biliary ductal dilatation and mildly  dilated common bile duct with diameter 8 mm. Two small indistinct filling defects in the lower third of the common bile duct on the 3D MRCP sequence measuring 4 mm (series 14/image 45) and 3 mm (series 14/image 47), which may represent small stones. Pancreas: No pancreatic mass or duct dilation.  No pancreas divisum. Spleen: Normal size. No mass. Adrenals/Urinary Tract: Normal adrenals. No hydronephrosis. Normal kidneys with no renal mass. Stomach/Bowel: Normal non-distended stomach. Visualized small and large bowel is normal caliber, with no bowel wall thickening. Moderate left colonic diverticulosis. Vascular/Lymphatic: Normal caliber abdominal aorta. No pathologically enlarged lymph nodes in the abdomen. Other: No abdominal ascites or focal fluid collection. Musculoskeletal: No aggressive appearing focal osseous lesions. IMPRESSION: 1. Cholelithiasis. No MRI evidence of acute cholecystitis. 2. Mild central intrahepatic biliary ductal dilatation and mildly dilated common bile duct with CBD diameter 8 mm. Two small indistinct filling defects in the lower third of the common bile duct on the 3D MRCP sequence measuring 4 mm and 3 mm, which may represent small stones/choledocholithiasis. 3. Moderate left colonic diverticulosis. Electronically Signed   By: Selinda DELENA Blue M.D.   On: 07/23/2023 13:02   MR 3D Recon At Scanner Result Date: 07/23/2023 CLINICAL DATA:  Right upper quadrant abdominal pain. Cholelithiasis. EXAM: MRI ABDOMEN WITHOUT CONTRAST  (INCLUDING MRCP) TECHNIQUE: Multiplanar multisequence MR imaging of the abdomen was performed. Heavily T2-weighted images of the biliary and pancreatic ducts were obtained, and three-dimensional MRCP images were rendered by post processing. COMPARISON:  Right upper quadrant abdominal sonogram for today. 08/24/2018 CT abdomen/pelvis. FINDINGS: Lower chest: No acute abnormality at the lung bases. Hepatobiliary: Normal liver size and configuration. No hepatic steatosis. No  liver mass. Layering small gallstones and small amount of layering sludge in the nondistended gallbladder, largest gallstone 4 mm. No gallbladder wall thickening or pericholecystic fluid. Mild central intrahepatic biliary ductal dilatation and mildly dilated common bile duct with diameter 8 mm. Two small indistinct filling defects in the lower third of the common bile duct on the 3D MRCP sequence measuring 4 mm (series 14/image 45) and 3 mm (series 14/image 47), which may represent small stones. Pancreas: No pancreatic mass or duct dilation.  No pancreas divisum. Spleen: Normal size. No mass. Adrenals/Urinary Tract: Normal adrenals. No hydronephrosis. Normal kidneys with no renal mass. Stomach/Bowel: Normal non-distended stomach. Visualized small and large bowel is normal caliber, with no bowel wall thickening. Moderate left colonic diverticulosis. Vascular/Lymphatic: Normal caliber abdominal aorta. No pathologically enlarged lymph nodes in the abdomen. Other: No abdominal ascites or focal fluid collection. Musculoskeletal: No aggressive appearing focal osseous lesions. IMPRESSION: 1. Cholelithiasis. No MRI evidence of acute cholecystitis. 2. Mild central intrahepatic biliary ductal dilatation and mildly dilated common bile duct with CBD diameter 8 mm. Two small indistinct filling defects in the lower third of the common bile duct on the 3D MRCP sequence measuring 4 mm and 3 mm, which may represent small stones/choledocholithiasis. 3. Moderate left colonic diverticulosis. Electronically Signed   By: Selinda DELENA Blue M.D.   On: 07/23/2023 13:02   US  ABDOMEN LIMITED RUQ (LIVER/GB) Result Date: 07/23/2023 CLINICAL DATA:  Right upper quadrant abdominal pain. EXAM: ULTRASOUND ABDOMEN LIMITED RIGHT UPPER QUADRANT COMPARISON:  None Available. FINDINGS: Gallbladder: The gallbladder is physiologically distended. Small volume gallstones/sludge noted. No abnormal  wall thickening or pericholecystic free fluid. Sonographic Murphy  sign was negative as per technologist. Common bile duct: Diameter: Up to 5.3 mm.  No intrahepatic bile duct dilation. Liver: No focal lesion identified. Within normal limits in parenchymal echogenicity. Portal vein is patent on color Doppler imaging with normal direction of blood flow towards the liver. Other: None. IMPRESSION: *Small volume cholelithiasis/sludge without imaging signs of acute cholecystitis. Electronically Signed   By: Ree Molt M.D.   On: 07/23/2023 10:54   Labs on Admission: I have personally reviewed following labs  CBC: Recent Labs  Lab 07/23/23 0950  WBC 5.2  HGB 14.2  HCT 42.6  MCV 91.6  PLT 258   Basic Metabolic Panel: Recent Labs  Lab 07/23/23 0950  NA 137  K 3.9  CL 106  CO2 24  GLUCOSE 93  BUN 11  CREATININE 0.74  CALCIUM 8.8*   GFR: Estimated Creatinine Clearance: 100.9 mL/min (by C-G formula based on SCr of 0.74 mg/dL).  Liver Function Tests: Recent Labs  Lab 07/23/23 0950  AST 632*  ALT 544*  ALKPHOS 102  BILITOT 2.8*  PROT 7.0  ALBUMIN 3.7   Recent Labs  Lab 07/23/23 0950  LIPASE 37   Urine analysis:    Component Value Date/Time   COLORURINE YELLOW (A) 07/23/2023 0944   APPEARANCEUR CLEAR (A) 07/23/2023 0944   LABSPEC 1.004 (L) 07/23/2023 0944   PHURINE 6.0 07/23/2023 0944   GLUCOSEU NEGATIVE 07/23/2023 0944   HGBUR SMALL (A) 07/23/2023 0944   BILIRUBINUR NEGATIVE 07/23/2023 0944   KETONESUR NEGATIVE 07/23/2023 0944   PROTEINUR NEGATIVE 07/23/2023 0944   NITRITE NEGATIVE 07/23/2023 0944   LEUKOCYTESUR NEGATIVE 07/23/2023 0944   This document was prepared using Dragon Voice Recognition software and may include unintentional dictation errors.  Dr. Sherre [facility]  If 7PM-7AM, please contact overnight-coverage provider If 7AM-7PM, please contact day attending provider [email or web address]  07/23/2023, 3:02 PM

## 2023-07-23 NOTE — ED Provider Notes (Signed)
 [facility] Provider Note    Event Date/Time   First MD Initiated Contact with Patient 07/23/23 1005     (approximate)   History   Chief Complaint: Abdominal Pain   HPI  Rhonda Gillespie is a 38 y.o. female with no significant past medical history who is coming to the ED due to right upper quadrant abdominal pain radiating around to the back that started last night after eating dinner from a [employer].  She went to bed, had worsening pain, associated with nausea.  Not able to eat this morning.  No fever or chest pain.          Physical Exam   Triage Vital Signs: ED Triage Vitals [07/23/23 0948]  Encounter Vitals Group     BP (!) 133/90     Systolic BP Percentile      Diastolic BP Percentile      Pulse Rate 75     Resp 18     Temp 98.2 F (36.8 C)     Temp src      SpO2 99 %     Weight 200 lb (90.7 kg)     Height 5' 3 (1.6 m)     Head Circumference      Peak Flow      Pain Score 6     Pain Loc      Pain Education      Exclude from Growth Chart     Most recent vital signs: Vitals:   07/23/23 0948  BP: (!) 133/90  Pulse: 75  Resp: 18  Temp: 98.2 F (36.8 C)  SpO2: 99%    General: Awake, no distress.  CV:  Good peripheral perfusion.  Regular rate rhythm Resp:  Normal effort.  Abd:  No distention.  Soft with right upper quadrant tenderness Other:  Moist oral mucosa   ED Results / Procedures / Treatments   Labs (all labs ordered are listed, but only abnormal results are displayed) Labs Reviewed  COMPREHENSIVE METABOLIC PANEL - Abnormal; Notable for the following components:      Result Value   Calcium 8.8 (*)    AST 632 (*)    ALT 544 (*)    Total Bilirubin 2.8 (*)    All other components within normal limits  URINALYSIS, ROUTINE W REFLEX MICROSCOPIC - Abnormal; Notable for the following components:   Color, Urine YELLOW (*)    APPearance CLEAR (*)    Specific Gravity, Urine 1.004 (*)    Hgb urine dipstick  SMALL (*)    Bacteria, UA MANY (*)    All other components within normal limits  LIPASE, BLOOD  CBC  PREGNANCY, URINE     EKG    RADIOLOGY Ultrasound right upper quadrant interpreted by me, shows gallstones, no frank signs of cholecystitis.  Radiology report reviewed   PROCEDURES:  Procedures   MEDICATIONS ORDERED IN ED: Medications  sodium chloride  0.9 % bolus 500 mL (has no administration in time range)  ketorolac  (TORADOL ) 15 MG/ML injection 15 mg (15 mg Intramuscular Given 07/23/23 1025)     IMPRESSION / MDM / ASSESSMENT AND PLAN / ED COURSE  I reviewed the triage vital signs and the nursing notes.  DDx: Cholecystitis, choledocholithiasis, pancreatitis, GERD  Patient's presentation is most consistent with acute presentation with potential threat to life or bodily function.  Patient presents with right upper quadrant abdominal pain after dinner last night.  Suspect biliary disease.  Labs do show elevated  LFTs.  No fever or leukocytosis.  Will obtain ultrasound, give IM Toradol  for pain relief   Clinical Course as of 07/23/23 1337  Fri Jul 23, 2023  1114 Ultrasound reveals cholelithiasis without signs of cholecystitis.  LFTs are elevated with transaminases about 600, T. bili 2.8.  No leukocytosis or fever.  Patient is still quite tender in the right upper quadrant.  Will obtain MRCP to look for choledocholithiasis. [PS]  1325 MRCP demonstrates 2 stones in the CBD.  Discussed with GI Dr. Unk who notes that Dr. Jinny is available for ERCP this weekend and patient can be admitted to hospitalist at [facility]. Surgery Dr. Tye aware. [PS]    Clinical Course User Index [PS] Viviann Pastor, MD     ----------------------------------------- 1:37 PM on 07/23/2023 ----------------------------------------- Discussed with hospitalist  FINAL CLINICAL IMPRESSION(S) / ED DIAGNOSES   Final diagnoses:  Choledocholithiasis     Rx / DC Orders   ED Discharge Orders      None        Note:  This document was prepared using Dragon voice recognition software and may include unintentional dictation errors.   Viviann Pastor, MD 07/23/23 [phone]

## 2023-07-23 NOTE — ED Notes (Signed)
 See triage note  Presents with some upper abd pain  States went to [facility]   then sent here for additional testing

## 2023-07-23 NOTE — Assessment & Plan Note (Addendum)
 Asymptomatic, present on admission Patient does not meet criteria for UTI Given patient has choledocholithiasis, we will continue antibiotic for choledocholithiasis which would cover any UTI/bacteriuria

## 2023-07-24 DIAGNOSIS — Z6839 Body mass index (BMI) 39.0-39.9, adult: Secondary | ICD-10-CM | POA: Diagnosis not present

## 2023-07-24 DIAGNOSIS — R748 Abnormal levels of other serum enzymes: Secondary | ICD-10-CM

## 2023-07-24 DIAGNOSIS — K8050 Calculus of bile duct without cholangitis or cholecystitis without obstruction: Secondary | ICD-10-CM | POA: Diagnosis not present

## 2023-07-24 DIAGNOSIS — R8271 Bacteriuria: Secondary | ICD-10-CM | POA: Diagnosis not present

## 2023-07-24 LAB — CBC
HCT: 41.5 % (ref 36.0–46.0)
Hemoglobin: 13.8 g/dL (ref 12.0–15.0)
MCH: 30.4 pg (ref 26.0–34.0)
MCHC: 33.3 g/dL (ref 30.0–36.0)
MCV: 91.4 fL (ref 80.0–100.0)
Platelets: 205 10*3/uL (ref 150–400)
RBC: 4.54 MIL/uL (ref 3.87–5.11)
RDW: 12.8 % (ref 11.5–15.5)
WBC: 5.4 10*3/uL (ref 4.0–10.5)
nRBC: 0 % (ref 0.0–0.2)

## 2023-07-24 LAB — HEPATIC FUNCTION PANEL
ALT: 499 U/L — ABNORMAL HIGH (ref 0–44)
AST: 249 U/L — ABNORMAL HIGH (ref 15–41)
Albumin: 3.2 g/dL — ABNORMAL LOW (ref 3.5–5.0)
Alkaline Phosphatase: 130 U/L — ABNORMAL HIGH (ref 38–126)
Bilirubin, Direct: 0.4 mg/dL — ABNORMAL HIGH (ref 0.0–0.2)
Indirect Bilirubin: 1.3 mg/dL — ABNORMAL HIGH (ref 0.3–0.9)
Total Bilirubin: 1.7 mg/dL — ABNORMAL HIGH (ref 0.0–1.2)
Total Protein: 6.1 g/dL — ABNORMAL LOW (ref 6.5–8.1)

## 2023-07-24 LAB — BASIC METABOLIC PANEL
Anion gap: 8 (ref 5–15)
BUN: 7 mg/dL (ref 6–20)
CO2: 22 mmol/L (ref 22–32)
Calcium: 8 mg/dL — ABNORMAL LOW (ref 8.9–10.3)
Chloride: 109 mmol/L (ref 98–111)
Creatinine, Ser: 0.63 mg/dL (ref 0.44–1.00)
GFR, Estimated: >60 mL/min (ref >60)
Glucose, Bld: 74 mg/dL (ref 70–99)
Potassium: 3.6 mmol/L (ref 3.5–5.1)
Sodium: 139 mmol/L (ref 135–145)

## 2023-07-24 LAB — HIV ANTIBODY (ROUTINE TESTING W REFLEX): HIV Screen 4th Generation wRfx: NONREACTIVE

## 2023-07-24 MED ORDER — ONDANSETRON 4 MG PO TBDP: 4.0000 mg | ORAL_TABLET | Freq: Three times a day (TID) | ORAL | 0 refills | Status: DC | PRN

## 2023-07-24 NOTE — ED Notes (Signed)
 Pt never received lunch tray. Dietary contacted and a new tray requested.

## 2023-07-24 NOTE — ED Notes (Signed)
 This tech called dining services at this time to inquire about pt's lunch tray. Dining services informed that pt's tray was on the way and would be here shortly.

## 2023-07-24 NOTE — Progress Notes (Signed)
 Subjective:  CC: Rhonda Gillespie is a 38 y.o. female  Hospital stay day 1,   choledocholithiasis  HPI: No issues overnight.  Tolerated some crackers yesterday.  No pain this morning.  Asking to go home.  ROS:  General: Denies weight loss, weight gain, fatigue, fevers, chills, and night sweats. Heart: Denies chest pain, palpitations, racing heart, irregular heartbeat, leg pain or swelling, and decreased activity tolerance. Respiratory: Denies breathing difficulty, shortness of breath, wheezing, cough, and sputum. GI: Denies change in appetite, heartburn, nausea, vomiting, constipation, diarrhea, and blood in stool. GU: Denies difficulty urinating, pain with urinating, urgency, frequency, blood in urine.   Objective:   Temp:  [97.7 F (36.5 C)-98.5 F (36.9 C)] 98.5 F (36.9 C) (01/11 0725) Pulse Rate:  [70-100] 100 (01/11 0725) Resp:  [18-20] 18 (01/11 0725) BP: (114-135)/(76-88) 115/77 (01/11 0725) SpO2:  [98 %-100 %] 100 % (01/11 0725)     Height: 5' 3 (160 cm) Weight: 90.7 kg BMI (Calculated): 35.44   Intake/Output this shift:   Intake/Output Summary (Last 24 hours) at 07/24/2023 1047 Last data filed at 07/24/2023 9662 Gross per 24 hour  Intake 100 ml  Output --  Net 100 ml    Constitutional :  alert, cooperative, appears stated age, and no distress  Respiratory:  clear to auscultation bilaterally  Cardiovascular:  regular rate and rhythm  Gastrointestinal: soft, non-tender; bowel sounds normal; no masses,  no organomegaly.   Skin: Cool and moist.   Psychiatric: Normal affect, non-agitated, not confused       LABS:     Latest Ref Rng & Units 07/24/2023    5:08 AM 07/23/2023    9:50 AM 08/02/2018    5:27 PM  CMP  Glucose 70 - 99 mg/dL 74  93  892   BUN 6 - 20 mg/dL 7  11  9    Creatinine 0.44 - 1.00 mg/dL 9.36  9.25  9.29   Sodium 135 - 145 mmol/L 139  137  137   Potassium 3.5 - 5.1 mmol/L 3.6  3.9  3.7   Chloride 98 - 111 mmol/L 109  106  101   CO2 22 - 32 mmol/L  22  24  29    Calcium 8.9 - 10.3 mg/dL 8.0  8.8  8.9   Total Protein 6.5 - 8.1 g/dL 6.1  7.0  7.7   Total Bilirubin 0.0 - 1.2 mg/dL 1.7  2.8  0.8   Alkaline Phos 38 - 126 U/L 130  102  76   AST 15 - 41 U/L 249  632  19   ALT 0 - 44 U/L 499  544  21       Latest Ref Rng & Units 07/24/2023    5:08 AM 07/23/2023    9:50 AM 08/02/2018    5:27 PM  CBC  WBC 4.0 - 10.5 K/uL 5.4  5.2  10.8   Hemoglobin 12.0 - 15.0 g/dL 86.1  85.7  85.6   Hematocrit 36.0 - 46.0 % 41.5  42.6  43.4   Platelets 150 - 400 K/uL 205  258  309     RADS: N/a Assessment:   Choledocholithiasis.  LFTs trending down this morning and patient is completely asymptomatic and hungry.  She is also requesting to go home rather than wait for the ERCP and robotic lap chole to be done during this admission.  I specifically discussed with her the concerns of a potential recurrence while waiting to perform the ERCP and/or robotic  lap chole as an outpatient basis.  Patient and family member at bedside verbalized understanding of the risks, and the possibility of the delay with the procedure since it will be scheduled as an elective case rather than a urgent inpatient case.  This conversation was completed after I offered her to proceed with the robotic lap chole case before proceeding with ERCP, since she has been n.p.o. since last night.  All questions addressed.  Follow-up contact information will be provided in the discharge instructions.  I instructed patient to call our office on Monday to schedule.  Also instructed patient to follow-up with the GI office to schedule her ERCP as well.  All team members updated of plan and patient preference.  As long as patient is able to tolerate a regular diet this morning, okay to DC from surgery standpoint.  labs/images/medications/previous chart entries reviewed personally and relevant changes/updates noted above.

## 2023-07-24 NOTE — Progress Notes (Signed)
 Brief GI consult note   I was consulted to see this patient who was admitted with choledocholithiasis and the possibility of ERCP over the weekend after discussing with Dr. Jinny. There was no evidence of ascending cholangitis, gallstone pancreatitis or acute cholecystitis.  Patient is kept n.p.o. since this morning. I have called patient over her cell phone in the ER after nurse messaged me that she is wanting to go home if there is no plan for ERCP today.  Patient stated that she is not experiencing any abdominal pain, nausea or vomiting.  She is hungry and would like to eat.  After discussing with Dr. Jinny, informed patient that we will not be able to offer ERCP today due to extreme weather conditions and in the absence of emergency of her condition.  Therefore, told her that we can schedule ERCP on Tuesday and she confirmed her availability.  Dr. Tye also discussed with her about doing cholecystectomy today and later proceed with ERCP on Monday as inpatient.   Patient stated that she still would like to go home today and come back as an outpatient Advised her to strictly follow low-carb, low-fat diet and stay well-hydrated until ERCP and surgery Patient expressed understanding of the plan.  If patient remains inpatient, full consult note to follow tomorrow  Corinn Brooklyn, MD

## 2023-07-24 NOTE — ED Notes (Signed)
 Pt consumed lunch tray. No c/o abd pain, nausea, or vomiting after eating.

## 2023-07-24 NOTE — Discharge Summary (Signed)
 Physician Discharge Summary   Patient: Rhonda Gillespie MRN: [id number] DOB: [birth year]  Admit date:     07/23/2023  Discharge date: 07/24/23  Discharge Physician: Concepcion Riser   PCP: Cleotilde Oneil FALCON, MD   Recommendations at discharge:    PCP follow up in 1 week. Surgery follow up as scheduled.  Discharge Diagnoses: Principal Problem:   Choledocholithiasis Active Problems:   Obese   BMI 39.0-39.9,adult   Bacteria in urine   Elevated liver enzymes   Constipation  Resolved Problems:   * No resolved hospital problems. [facility] Course: Rhonda Gillespie is a 38 year old female with history of obesity, who presents to the emergency department for chief concerns of abdominal pain.  Vitals in the ED showed temperature of 98.2, respiration rate of 18, heart rate 75, blood pressure 133/90, SpO2 99% on room air.  Serum sodium is 137, potassium 3.9, chloride 106, bicarb 24, BUN of 11, serum creatinine 0.74, EGFR greater than 60, nonfasting blood glucose 93, WBC 5.2, hemoglobin 14.2, platelets of 258.  UA was negative for leukocytes and nitrates pending  ED treatment: Ketorolac  15 mg IM one-time dose, sodium chloride  500 mL liter bolus.  EDP consulted GI specialist on-call who states that ERCP provider will be available this weekend to perform ERCP if indicated.  EDP also consulted general surgery who is aware of the patient.  Assessment and Plan: * Choledocholithiasis Stopped ceftriaxone  2 g IV daily, metronidazole  500 mg IV. Stopped IV fluids Patient got IV opiate medications for pain control. General surgery team evaluated the patient, recommended robotic lap chole after ERCP to prevent future recurrence.  Today patient is pain-free, LFTs trended down, feels hungry and is requesting to go home rather than wait for ERCP and/or robotic laparoscopic cholecystectomy. Even after explaining the risks of going home without ERCP and surgery, patient and family decided to go home.   General surgery advised if she is able to tolerate diet can be discharged with outpatient lap chole scheduling. Patient is advised regarding diet, exercise and weight reduction.  Understands to follow-up with PCP, GI and surgery team upon discharge as instructed.  Patient and husband understand and agree with the discharge plan.       Consultants: GI, Surgery Procedures performed: none  Disposition: Home Diet recommendation:  Discharge Diet Orders (From admission, onward)     Start     Ordered   07/24/23 0000  Diet - low sodium heart healthy       Comments: Bland diet for next few days   07/24/23 1450           Regular diet DISCHARGE MEDICATION: Allergies as of 07/24/2023       Reactions   Amoxicillin Hives        Medication List     STOP taking these medications    FLUoxetine 10 MG tablet Commonly known as: PROZAC   ketoconazole  2 % shampoo Commonly known as: NIZORAL        TAKE these medications    ondansetron  4 MG disintegrating tablet Commonly known as: ZOFRAN -ODT Take 1 tablet (4 mg total) by mouth every 8 (eight) hours as needed for nausea or vomiting.   spironolactone  100 MG tablet Commonly known as: ALDACTONE  TAKE 1 TABLET BY MOUTH EVERY DAY        Follow-up Information     Sakai, Isami, DO Follow up.   Specialties: General Surgery, Surgery Why: call office on monday 07/26/23 after 0830 to schedule elective gallbladder surgery Contact  information: [address] [place] [address] [phone]         Cleotilde Oneil FALCON, MD Follow up in 1 week(s).   Specialty: Internal Medicine Contact information: [address] [facility] [place] [address] [phone]                Discharge Exam: Rhonda Gillespie   07/23/23 0948  Weight: 90.7 kg   General - Young Caucasian obese female, no apparent distress HEENT - PERRLA, EOMI, atraumatic head, non tender sinuses. Lung - Clear, rales, rhonchi,  wheezes. Heart - S1, S2 heard, no murmurs, rubs, no pedal edema. Abdomen-soft, mild epigastric tenderness, nondistended, bowel sounds good Neuro - Alert, awake and oriented x 3, non focal exam. Skin - Warm and dry.  Condition at discharge: stable  The results of significant diagnostics from this hospitalization (including imaging, microbiology, ancillary and laboratory) are listed below for reference.   Imaging Studies: MR ABDOMEN MRCP WO CONTRAST Result Date: 07/23/2023 CLINICAL DATA:  Right upper quadrant abdominal pain. Cholelithiasis. EXAM: MRI ABDOMEN WITHOUT CONTRAST  (INCLUDING MRCP) TECHNIQUE: Multiplanar multisequence MR imaging of the abdomen was performed. Heavily T2-weighted images of the biliary and pancreatic ducts were obtained, and three-dimensional MRCP images were rendered by post processing. COMPARISON:  Right upper quadrant abdominal sonogram for today. 08/24/2018 CT abdomen/pelvis. FINDINGS: Lower chest: No acute abnormality at the lung bases. Hepatobiliary: Normal liver size and configuration. No hepatic steatosis. No liver mass. Layering small gallstones and small amount of layering sludge in the nondistended gallbladder, largest gallstone 4 mm. No gallbladder wall thickening or pericholecystic fluid. Mild central intrahepatic biliary ductal dilatation and mildly dilated common bile duct with diameter 8 mm. Two small indistinct filling defects in the lower third of the common bile duct on the 3D MRCP sequence measuring 4 mm (series 14/image 45) and 3 mm (series 14/image 47), which may represent small stones. Pancreas: No pancreatic mass or duct dilation.  No pancreas divisum. Spleen: Normal size. No mass. Adrenals/Urinary Tract: Normal adrenals. No hydronephrosis. Normal kidneys with no renal mass. Stomach/Bowel: Normal non-distended stomach. Visualized small and large bowel is normal caliber, with no bowel wall thickening. Moderate left colonic diverticulosis. Vascular/Lymphatic:  Normal caliber abdominal aorta. No pathologically enlarged lymph nodes in the abdomen. Other: No abdominal ascites or focal fluid collection. Musculoskeletal: No aggressive appearing focal osseous lesions. IMPRESSION: 1. Cholelithiasis. No MRI evidence of acute cholecystitis. 2. Mild central intrahepatic biliary ductal dilatation and mildly dilated common bile duct with CBD diameter 8 mm. Two small indistinct filling defects in the lower third of the common bile duct on the 3D MRCP sequence measuring 4 mm and 3 mm, which may represent small stones/choledocholithiasis. 3. Moderate left colonic diverticulosis. Electronically Signed   By: Selinda DELENA Blue M.D.   On: 07/23/2023 13:02   MR 3D Recon At Scanner Result Date: 07/23/2023 CLINICAL DATA:  Right upper quadrant abdominal pain. Cholelithiasis. EXAM: MRI ABDOMEN WITHOUT CONTRAST  (INCLUDING MRCP) TECHNIQUE: Multiplanar multisequence MR imaging of the abdomen was performed. Heavily T2-weighted images of the biliary and pancreatic ducts were obtained, and three-dimensional MRCP images were rendered by post processing. COMPARISON:  Right upper quadrant abdominal sonogram for today. 08/24/2018 CT abdomen/pelvis. FINDINGS: Lower chest: No acute abnormality at the lung bases. Hepatobiliary: Normal liver size and configuration. No hepatic steatosis. No liver mass. Layering small gallstones and small amount of layering sludge in the nondistended gallbladder, largest gallstone 4 mm. No gallbladder wall thickening or pericholecystic fluid. Mild central intrahepatic biliary  ductal dilatation and mildly dilated common bile duct with diameter 8 mm. Two small indistinct filling defects in the lower third of the common bile duct on the 3D MRCP sequence measuring 4 mm (series 14/image 45) and 3 mm (series 14/image 47), which may represent small stones. Pancreas: No pancreatic mass or duct dilation.  No pancreas divisum. Spleen: Normal size. No mass. Adrenals/Urinary Tract: Normal  adrenals. No hydronephrosis. Normal kidneys with no renal mass. Stomach/Bowel: Normal non-distended stomach. Visualized small and large bowel is normal caliber, with no bowel wall thickening. Moderate left colonic diverticulosis. Vascular/Lymphatic: Normal caliber abdominal aorta. No pathologically enlarged lymph nodes in the abdomen. Other: No abdominal ascites or focal fluid collection. Musculoskeletal: No aggressive appearing focal osseous lesions. IMPRESSION: 1. Cholelithiasis. No MRI evidence of acute cholecystitis. 2. Mild central intrahepatic biliary ductal dilatation and mildly dilated common bile duct with CBD diameter 8 mm. Two small indistinct filling defects in the lower third of the common bile duct on the 3D MRCP sequence measuring 4 mm and 3 mm, which may represent small stones/choledocholithiasis. 3. Moderate left colonic diverticulosis. Electronically Signed   By: Selinda DELENA Blue M.D.   On: 07/23/2023 13:02   US  ABDOMEN LIMITED RUQ (LIVER/GB) Result Date: 07/23/2023 CLINICAL DATA:  Right upper quadrant abdominal pain. EXAM: ULTRASOUND ABDOMEN LIMITED RIGHT UPPER QUADRANT COMPARISON:  None Available. FINDINGS: Gallbladder: The gallbladder is physiologically distended. Small volume gallstones/sludge noted. No abnormal wall thickening or pericholecystic free fluid. Sonographic Murphy sign was negative as per technologist. Common bile duct: Diameter: Up to 5.3 mm.  No intrahepatic bile duct dilation. Liver: No focal lesion identified. Within normal limits in parenchymal echogenicity. Portal vein is patent on color Doppler imaging with normal direction of blood flow towards the liver. Other: None. IMPRESSION: *Small volume cholelithiasis/sludge without imaging signs of acute cholecystitis. Electronically Signed   By: Ree Molt M.D.   On: 07/23/2023 10:54    Microbiology: Results for orders placed or performed in visit on 05/24/19  Novel Coronavirus, NAA ([facility])     Status: Abnormal    Collection Time: 05/24/19 12:00 AM   Specimen: Nasopharyngeal(NP) swabs in vial transport medium   NASOPHARYNGE  TESTING  Result Value Ref Range Status   SARS-CoV-2, NAA Detected (A) Not Detected Final    Comment: This nucleic acid amplification test was developed and its performance characteristics determined by [employer]. Nucleic acid amplification tests include PCR and TMA. This test has not been FDA cleared or approved. This test has been authorized by FDA under an Emergency Use Authorization (EUA). This test is only authorized for the duration of time the declaration that circumstances exist justifying the authorization of the emergency use of in vitro diagnostic tests for detection of SARS-CoV-2 virus and/or diagnosis of COVID-19 infection under section 564(b)(1) of the Act, 21 U.S.C. 639aaa-6(a) (1), unless the authorization is terminated or revoked sooner. When diagnostic testing is negative, the possibility of a false negative result should be considered in the context of a patient's recent exposures and the presence of clinical signs and symptoms consistent with COVID-19. An individual without symptoms of COVID-19 and who is not shedding SARS-CoV-2 virus would  expect to have a negative (not detected) result in this assay.     Labs: CBC: Recent Labs  Lab 07/23/23 0950 07/24/23 0508  WBC 5.2 5.4  HGB 14.2 13.8  HCT 42.6 41.5  MCV 91.6 91.4  PLT 258 205   Basic Metabolic Panel: Recent Labs  Lab 07/23/23 0950 07/24/23 0508  NA 137 139  K 3.9 3.6  CL 106 109  CO2 24 22  GLUCOSE 93 74  BUN 11 7  CREATININE 0.74 0.63  CALCIUM 8.8* 8.0*   Liver Function Tests: Recent Labs  Lab 07/23/23 0950 07/24/23 0508  AST 632* 249*  ALT 544* 499*  ALKPHOS 102 130*  BILITOT 2.8* 1.7*  PROT 7.0 6.1*  ALBUMIN 3.7 3.2*   CBG: No results for input(s): GLUCAP in the last 168 hours.  Discharge time spent: 33 minutes.  Signed: Concepcion Riser,  MD [facility] 07/24/2023

## 2023-07-26 ENCOUNTER — Telehealth: Payer: Self-pay

## 2023-07-26 ENCOUNTER — Other Ambulatory Visit: Payer: Self-pay | Admitting: Gastroenterology

## 2023-07-26 ENCOUNTER — Other Ambulatory Visit: Payer: Self-pay

## 2023-07-26 DIAGNOSIS — K838 Other specified diseases of biliary tract: Secondary | ICD-10-CM

## 2023-07-26 DIAGNOSIS — K8050 Calculus of bile duct without cholangitis or cholecystitis without obstruction: Secondary | ICD-10-CM

## 2023-07-26 NOTE — Telephone Encounter (Signed)
-----   Message from Rogelia Copping sent at 07/24/2023  7:31 PM EST ----- Regarding: RE: ERCP It will need to be in the afternoon or better if Wednesday. I am teaching and won't be back in the hospital until 1:30ish. ----- Message ----- From: Unk Corinn Skiff, MD Sent: 07/24/2023   7:29 PM EST To: Andrea CINDERELLA Bench, CMA; Rogelia Copping, MD Subject: ERCP                                           Andrea  She was admitted to hospital over the weekend due to choledocholithiasis Please schedule ERCP for 1/14. She said she can come and Dr Copping is aware  Thanks RV

## 2023-07-26 NOTE — Telephone Encounter (Signed)
 I spoke to pt and she has been scheduled for 07/28/2023... Instructions released to Mychart

## 2023-07-28 ENCOUNTER — Ambulatory Visit
Admission: RE | Admit: 2023-07-28 | Discharge: 2023-07-28 | Disposition: A | Discharge status: Home / Self Care | Payer: BC Managed Care – PPO | Attending: Gastroenterology | Admitting: Gastroenterology

## 2023-07-28 ENCOUNTER — Encounter
Admission: RE | Disposition: A | Discharge status: Home / Self Care | Payer: Self-pay | Source: Home / Self Care | Attending: Gastroenterology

## 2023-07-28 ENCOUNTER — Ambulatory Visit: Payer: BC Managed Care – PPO | Admitting: Certified Registered"

## 2023-07-28 ENCOUNTER — Encounter: Payer: Self-pay | Admitting: Gastroenterology

## 2023-07-28 ENCOUNTER — Ambulatory Visit: Payer: BC Managed Care – PPO

## 2023-07-28 DIAGNOSIS — R932 Abnormal findings on diagnostic imaging of liver and biliary tract: Secondary | ICD-10-CM | POA: Diagnosis not present

## 2023-07-28 DIAGNOSIS — Z79899 Other long term (current) drug therapy: Secondary | ICD-10-CM | POA: Diagnosis not present

## 2023-07-28 DIAGNOSIS — R933 Abnormal findings on diagnostic imaging of other parts of digestive tract: Secondary | ICD-10-CM

## 2023-07-28 DIAGNOSIS — K8050 Calculus of bile duct without cholangitis or cholecystitis without obstruction: Secondary | ICD-10-CM

## 2023-07-28 DIAGNOSIS — G43909 Migraine, unspecified, not intractable, without status migrainosus: Secondary | ICD-10-CM | POA: Insufficient documentation

## 2023-07-28 DIAGNOSIS — K838 Other specified diseases of biliary tract: Secondary | ICD-10-CM

## 2023-07-28 HISTORY — PX: ENDOSCOPIC RETROGRADE CHOLANGIOPANCREATOGRAPHY (ERCP) WITH PROPOFOL: SHX5810

## 2023-07-28 HISTORY — PX: SPHINCTEROTOMY: SHX5544

## 2023-07-28 HISTORY — PX: REMOVAL OF STONES: SHX5545

## 2023-07-28 SURGERY — ENDOSCOPIC RETROGRADE CHOLANGIOPANCREATOGRAPHY (ERCP) WITH PROPOFOL
Anesthesia: General

## 2023-07-28 MED ORDER — DICLOFENAC SUPPOSITORY 100 MG
100.0000 mg | Freq: Once | RECTAL | Status: AC
  Administered 2023-07-28: 100 mg via RECTAL

## 2023-07-28 MED ORDER — SODIUM CHLORIDE 0.9 % IV SOLN: INTRAVENOUS | Status: DC

## 2023-07-28 MED ORDER — PROPOFOL 500 MG/50ML IV EMUL
INTRAVENOUS | Status: DC | PRN
  Administered 2023-07-28: 120 mg via INTRAVENOUS
  Administered 2023-07-28: 160 ug/kg/min via INTRAVENOUS

## 2023-07-28 MED ORDER — GLYCOPYRROLATE 0.2 MG/ML IJ SOLN
INTRAMUSCULAR | Status: AC
  Filled 2023-07-28: qty 1

## 2023-07-28 MED ORDER — DICLOFENAC SUPPOSITORY 100 MG
RECTAL | Status: AC
  Filled 2023-07-28: qty 1

## 2023-07-28 MED ORDER — LIDOCAINE HCL (PF) 2 % IJ SOLN
INTRAMUSCULAR | Status: DC | PRN
  Administered 2023-07-28: 100 mg via INTRADERMAL

## 2023-07-28 MED ORDER — DEXMEDETOMIDINE HCL IN NACL 80 MCG/20ML IV SOLN
INTRAVENOUS | Status: DC | PRN
  Administered 2023-07-28: 8 ug via INTRAVENOUS

## 2023-07-28 MED ORDER — GLYCOPYRROLATE 0.2 MG/ML IJ SOLN
INTRAMUSCULAR | Status: DC | PRN
  Administered 2023-07-28: .2 mg via INTRAVENOUS

## 2023-07-28 NOTE — Anesthesia Postprocedure Evaluation (Signed)
 Anesthesia Post Note  Patient: Rhonda Gillespie  Procedure(s) Performed: ENDOSCOPIC RETROGRADE CHOLANGIOPANCREATOGRAPHY (ERCP) WITH PROPOFOL  SPHINCTEROTOMY REMOVAL OF STONES  Patient location during evaluation: Endoscopy Anesthesia Type: General Level of consciousness: awake and alert Pain management: pain level controlled Vital Signs Assessment: post-procedure vital signs reviewed and stable Respiratory status: spontaneous breathing, nonlabored ventilation, respiratory function stable and patient connected to nasal cannula oxygen Cardiovascular status: blood pressure returned to baseline and stable Postop Assessment: no apparent nausea or vomiting Anesthetic complications: no   No notable events documented.   Last Vitals:  Vitals:   07/28/23 1140 07/28/23 1149  BP: (!) 86/59 98/64  Pulse: 96   Resp: (!) 6   Temp:    SpO2: 94%     Last Pain:  Vitals:   07/28/23 1149  TempSrc:   PainSc: 0-No pain                 Nancey Awkward

## 2023-07-28 NOTE — Transfer of Care (Signed)
 Immediate Anesthesia Transfer of Care Note  Patient: Rhonda Gillespie  Procedure(s) Performed: ENDOSCOPIC RETROGRADE CHOLANGIOPANCREATOGRAPHY (ERCP) WITH PROPOFOL  SPHINCTEROTOMY REMOVAL OF STONES  Patient Location: Endoscopy Unit  Anesthesia Type:General  Level of Consciousness: drowsy  Airway & Oxygen Therapy: Patient Spontanous Breathing  Post-op Assessment: Report given to RN and Post -op Vital signs reviewed and stable  Post vital signs: Reviewed and stable  Last Vitals:  Vitals Value Taken Time  BP 98/74 07/28/23 1131  Temp 35.8 1131  Pulse 95 07/28/23 1131  Resp 21 07/28/23 1131  SpO2 93 % 07/28/23 1131  Vitals shown include unfiled device data.  Last Pain:  Vitals:   07/28/23 1131  TempSrc:   PainSc: 0-No pain         Complications: No notable events documented.

## 2023-07-28 NOTE — H&P (Signed)
   Marnee Sink, MD [facility] [address], [address] [place], [place] [address] Phone:[phone] Fax : [phone]  Primary Care Physician:  Sari Cunning, MD Primary Gastroenterologist:  Dr. Ole Berkeley  Pre-Procedure History & Physical: HPI:  Rhonda Gillespie is a 38 y.o. female is here for an ERCP.   Past Medical History:  Diagnosis Date   Choledocholithiasis    Migraine headache     Past Surgical History:  Procedure Laterality Date   CESAREAN SECTION  2011,2012,2014   x3   TUBAL LIGATION  2014    Prior to Admission medications   Medication Sig Start Date End Date Taking? Authorizing Provider  spironolactone  (ALDACTONE ) 100 MG tablet TAKE 1 TABLET BY MOUTH EVERY DAY 05/12/21  Yes Artemio Larry, MD  ondansetron  (ZOFRAN -ODT) 4 MG disintegrating tablet Take 1 tablet (4 mg total) by mouth every 8 (eight) hours as needed for nausea or vomiting. 07/24/23   Aisha Hove, MD    Allergies as of 07/26/2023 - Review Complete 07/23/2023  Allergen Reaction Noted   Amoxicillin Hives 06/19/2015    Family History  Problem Relation Age of Onset   Depression Mother    Depression Father    Diabetes Father    Heart disease Maternal Grandmother    Diabetes Maternal Grandmother    Depression Maternal Grandfather    Cancer Paternal Grandfather     Social History   Socioeconomic History   Marital status: Married    Spouse name: Not on file   Number of children: Not on file   Years of education: Not on file   Highest education level: Not on file  Occupational History   Not on file  Tobacco Use   Smoking status: Never   Smokeless tobacco: Never  Vaping Use   Vaping status: Never Used  Substance and Sexual Activity   Alcohol use: No   Drug use: No   Sexual activity: Yes    Partners: Male  Other Topics Concern   Not on file  Social History Narrative   Not on file   Social Drivers of Health   Financial Resource Strain: Not on file  Food Insecurity: Not on file   Transportation Needs: Not on file  Physical Activity: Not on file  Stress: Not on file  Social Connections: Not on file  Intimate Partner Violence: Not on file    Review of Systems: See HPI, otherwise negative ROS  Physical Exam: BP 126/83   Pulse 98   Temp (!) 97.5 F (36.4 C) (Temporal)   Resp 16   Ht 5\' 3"  (1.6 m)   Wt 87.7 kg   LMP 07/28/2023 (Approximate)   SpO2 100%   BMI 34.26 kg/m  General:   Alert,  pleasant and cooperative in NAD Head:  Normocephalic and atraumatic. Neck:  Supple; no masses or thyromegaly. Lungs:  Clear throughout to auscultation.    Heart:  Regular rate and rhythm. Abdomen:  Soft, nontender and nondistended. Normal bowel sounds, without guarding, and without rebound.   Neurologic:  Alert and  oriented x4;  grossly normal neurologically.  Impression/Plan: Rhonda Gillespie is here for an ERCP to be performed for CBD stones  Risks, benefits, limitations, and alternatives regarding  ERCP have been reviewed with the patient.  Questions have been answered.  All parties agreeable.   Marnee Sink, MD  07/28/2023, 10:56 AM

## 2023-07-28 NOTE — Op Note (Addendum)
 [facility] Gastroenterology Patient Name: Rhonda Gillespie Procedure Date: 07/28/2023 11:07 AM MRN: [id number] Account #: [id number] Date of Birth: [birth year] Admit Type: Outpatient Age: 38 Room: [facility] ENDO ROOM 4 Gender: Female Note Status: Finalized Instrument Name: Jhonnie Mosher [id number] Procedure:             ERCP Indications:           Abnormal MRCP Providers:             Marnee Sink MD, MD Referring MD:          Sari Cunning, MD (Referring MD) Medicines:             Propofol  per Anesthesia Complications:         No immediate complications. Procedure:             Pre-Anesthesia Assessment:                        - Prior to the procedure, a History and Physical was                         performed, and patient medications and allergies were                         reviewed. The patient's tolerance of previous                         anesthesia was also reviewed. The risks and benefits                         of the procedure and the sedation options and risks                         were discussed with the patient. All questions were                         answered, and informed consent was obtained. Prior                         Anticoagulants: The patient has taken no anticoagulant                         or antiplatelet agents. ASA Grade Assessment: II - A                         patient with mild systemic disease. After reviewing                         the risks and benefits, the patient was deemed in                         satisfactory condition to undergo the procedure.                        After obtaining informed consent, the scope was passed                         under direct vision. Throughout the procedure, the  patient's blood pressure, pulse, and oxygen                         saturations were monitored continuously. The                         Duodenoscope was introduced through the mouth, and                         used  to inject contrast into and used to cannulate the                         bile duct. The ERCP was accomplished without                         difficulty. The patient tolerated the procedure well.                         The ERCP was accomplished without difficulty. The                         patient tolerated the procedure well. Findings:      The scout film was normal. The esophagus was successfully intubated       under direct vision. The scope was advanced to a normal major papilla in       the descending duodenum without detailed examination of the pharynx,       larynx and associated structures, and upper GI tract. The upper GI tract       was grossly normal. The bile duct was deeply cannulated with the       short-nosed traction sphincterotome. Contrast was injected. I personally       interpreted the bile duct images. There was brisk flow of contrast       through the ducts. Image quality was excellent. Contrast extended to the       entire biliary tree. The middle third of the main bile duct contained       filling defect(s) thought to be sludge. A wire was passed into the       biliary tree. A 6 mm biliary sphincterotomy was made with a traction       (standard) sphincterotome using ERBE electrocautery. There was no       post-sphincterotomy bleeding. The biliary tree was swept with a 15 mm       balloon starting at the bifurcation. Sludge was swept from the duct. Impression:            - The examination was suspicious for sludge.                        - A biliary sphincterotomy was performed.                        - The biliary tree was swept and sludge was found. Recommendation:        - Discharge patient to home.                        - Clear liquid diet today.                        -  Watch for pancreatitis, bleeding, perforation, and                         cholangitis.                        - Continue present medications. Procedure Code(s):     --- Professional ---                         [phone], Endoscopic retrograde cholangiopancreatography                         (ERCP); with removal of calculi/debris from                         biliary/pancreatic duct(s)                        [id number], Endoscopic retrograde cholangiopancreatography                         (ERCP); with sphincterotomy/papillotomy                        [phone], Endoscopic catheterization of the biliary                         ductal system, radiological supervision and                         interpretation Diagnosis Code(s):     --- Professional ---                        R93.2, Abnormal findings on diagnostic imaging of                         liver and biliary tract CPT copyright 2022 American Medical Association. All rights reserved. The codes documented in this report are preliminary and upon coder review may  be revised to meet current compliance requirements. Marnee Sink MD, MD 07/28/2023 11:26:07 AM This report has been signed electronically. Number of Addenda: 0 Note Initiated On: 07/28/2023 11:07 AM Estimated Blood Loss:  Estimated blood loss: none.      [facility]

## 2023-07-28 NOTE — Anesthesia Preprocedure Evaluation (Signed)
 Anesthesia Evaluation  Patient identified by MRN, date of birth, ID band Patient awake    Reviewed: Allergy & Precautions, NPO status , Patient's Chart, lab work & pertinent test results  History of Anesthesia Complications Negative for: history of anesthetic complications  Airway Mallampati: III  TM Distance: >3 FB Neck ROM: full    Dental no notable dental hx.    Pulmonary neg pulmonary ROS   Pulmonary exam normal        Cardiovascular negative cardio ROS Normal cardiovascular exam     Neuro/Psych  Headaches  negative psych ROS   GI/Hepatic negative GI ROS, Neg liver ROS,,,  Endo/Other  negative endocrine ROS    Renal/GU negative Renal ROS  negative genitourinary   Musculoskeletal   Abdominal   Peds  Hematology negative hematology ROS (+)   Anesthesia Other Findings Past Medical History: No date: Choledocholithiasis No date: Migraine headache  Past Surgical History: 2011,2012,2014: CESAREAN SECTION     Comment:  x3 2014: TUBAL LIGATION     Reproductive/Obstetrics negative OB ROS                             Anesthesia Physical Anesthesia Plan  ASA: 2  Anesthesia Plan: General   Post-op Pain Management: Minimal or no pain anticipated   Induction: Intravenous  PONV Risk Score and Plan: 2 and Propofol  infusion and TIVA  Airway Management Planned: Natural Airway and Nasal Cannula  Additional Equipment:   Intra-op Plan:   Post-operative Plan:   Informed Consent: I have reviewed the patients History and Physical, chart, labs and discussed the procedure including the risks, benefits and alternatives for the proposed anesthesia with the patient or authorized representative who has indicated his/her understanding and acceptance.     Dental Advisory Given  Plan Discussed with: Anesthesiologist, CRNA and Surgeon  Anesthesia Plan Comments: (Patient consented for risks of  anesthesia including but not limited to:  - adverse reactions to medications - risk of airway placement if required - damage to eyes, teeth, lips or other oral mucosa - nerve damage due to positioning  - sore throat or hoarseness - Damage to heart, brain, nerves, lungs, other parts of body or loss of life  Patient voiced understanding and assent.)       Anesthesia Quick Evaluation

## 2023-07-29 ENCOUNTER — Encounter: Payer: Self-pay | Admitting: Gastroenterology

## 2023-08-04 ENCOUNTER — Encounter
Admission: RE | Admit: 2023-08-04 | Discharge: 2023-08-04 | Disposition: A | Discharge status: Home / Self Care | Payer: BC Managed Care – PPO | Source: Ambulatory Visit | Attending: Surgery | Admitting: Surgery

## 2023-08-04 ENCOUNTER — Ambulatory Visit: Payer: Self-pay | Admitting: Surgery

## 2023-08-04 ENCOUNTER — Other Ambulatory Visit: Payer: Self-pay

## 2023-08-04 VITALS — Ht 63.0 in | Wt 190.0 lb

## 2023-08-04 DIAGNOSIS — Z01812 Encounter for preprocedural laboratory examination: Secondary | ICD-10-CM

## 2023-08-04 HISTORY — DX: Other specified postprocedural states: Z98.890

## 2023-08-04 NOTE — Patient Instructions (Addendum)
Your procedure is scheduled on: Thursday 08/12/23  Report to the Registration Desk on the 1st floor of the [facility]. To find out your arrival time, please call [phone] between 1PM - 3PM on: Wednesday 08/11/23  If your arrival time is 6:00 am, do not arrive before that time as the [facility] entrance doors do not open until 6:00 am.  REMEMBER: Instructions that are not followed completely may result in serious medical risk, up to and including death; or upon the discretion of your surgeon and anesthesiologist your surgery may need to be rescheduled.  Do not eat food after midnight the night before surgery.  No gum chewing or hard candies.  You may however, drink CLEAR liquids up to 2 hours before you are scheduled to arrive for your surgery. Do not drink anything within 2 hours of your scheduled arrival time.  Clear liquids include: - water  - apple juice without pulp - gatorade (not RED colors) - black coffee or tea (Do NOT add milk or creamers to the coffee or tea) Do NOT drink anything that is not on this list.   One week prior to surgery: Stop Anti-inflammatories (NSAIDS) such as Advil, Aleve, Ibuprofen, Motrin, Naproxen, Naprosyn and Aspirin based products such as Excedrin, Goody's Powder, BC Powder. Stop ANY OVER THE COUNTER supplements until after surgery.  You may however, continue to take Tylenol if needed for pain up until the day of surgery.   Continue taking all of your other prescription medications up until the day of surgery.  ON THE DAY OF SURGERY ONLY TAKE THESE MEDICATIONS WITH SIPS OF WATER:  None   No Alcohol for 24 hours before or after surgery.  No Smoking including e-cigarettes for 24 hours before surgery.  No chewable tobacco products for at least 6 hours before surgery.  No nicotine patches on the day of surgery.  Do not use any "recreational" drugs for at least a week (preferably 2 weeks) before your surgery.  Please be advised that the  combination of cocaine and anesthesia may have negative outcomes, up to and including death. If you test positive for cocaine, your surgery will be cancelled.  On the morning of surgery brush your teeth with toothpaste and water, you may rinse your mouth with mouthwash if you wish. Do not swallow any toothpaste or mouthwash.  Use CHG Soap or wipes as directed on instruction sheet.  Do not wear jewelry, make-up, hairpins, clips or nail polish.  For welded (permanent) jewelry: bracelets, anklets, waist bands, etc.  Please have this removed prior to surgery.  If it is not removed, there is a chance that hospital personnel will need to cut it off on the day of surgery.  Do not wear lotions, powders, or perfumes.   Do not shave body hair from the neck down 48 hours before surgery.  Contact lenses, hearing aids and dentures may not be worn into surgery.  Do not bring valuables to the hospital. [facility] is not responsible for any missing/lost belongings or valuables.   Notify your doctor if there is any change in your medical condition (cold, fever, infection).  Wear comfortable clothing (specific to your surgery type) to the hospital.  After surgery, you can help prevent lung complications by doing breathing exercises.  Take deep breaths and cough every 1-2 hours. Your doctor may order a device called an Incentive Spirometer to help you take deep breaths. When coughing or sneezing, hold a pillow firmly against your incision with  both hands. This is called "splinting." Doing this helps protect your incision. It also decreases belly discomfort.  If you are being admitted to the hospital overnight, leave your suitcase in the car. After surgery it may be brought to your room.  In case of increased patient census, it may be necessary for you, the patient, to continue your postoperative care in the Same Day Surgery department.  If you are being discharged the day of surgery, you will not be  allowed to drive home. You will need a responsible individual to drive you home and stay with you for 24 hours after surgery.   If you are taking public transportation, you will need to have a responsible individual with you.  Please call the Pre-admissions Testing Dept. at [phone] if you have any questions about these instructions.  Surgery Visitation Policy:  Patients having surgery or a procedure may have two visitors.  Children under the age of 75 must have an adult with them who is not the patient.  Temporary Visitor Restrictions Due to increasing cases of flu, RSV and COVID-19: Children ages 42 and under will not be able to visit patients in [facility] hospitals under most circumstances.  Inpatient Visitation:    Visiting hours are 7 a.m. to 8 p.m. Up to four visitors are allowed at one time in a patient room. The visitors may rotate out with other people during the day.  One visitor age 73 or older may stay with the patient overnight and must be in the room by 8 p.m.    Preparing for Surgery with CHLORHEXIDINE GLUCONATE (CHG) Soap  Chlorhexidine Gluconate (CHG) Soap  o An antiseptic cleaner that kills germs and bonds with the skin to continue killing germs even after washing  o Used for showering the night before surgery and morning of surgery  Before surgery, you can play an important role by reducing the number of germs on your skin.  CHG (Chlorhexidine gluconate) soap is an antiseptic cleanser which kills germs and bonds with the skin to continue killing germs even after washing.  Please do not use if you have an allergy to CHG or antibacterial soaps. If your skin becomes reddened/irritated stop using the CHG.  1. Shower the NIGHT BEFORE SURGERY and the MORNING OF SURGERY with CHG soap.  2. If you choose to wash your hair, wash your hair first as usual with your normal shampoo.  3. After shampooing, rinse your hair and body thoroughly to remove the  shampoo.  4. Use CHG as you would any other liquid soap. You can apply CHG directly to the skin and wash gently with a scrungie or a clean washcloth.  5. Apply the CHG soap to your body only from the neck down. Do not use on open wounds or open sores. Avoid contact with your eyes, ears, mouth, and genitals (private parts). Wash face and genitals (private parts) with your normal soap.  6. Wash thoroughly, paying special attention to the area where your surgery will be performed.  7. Thoroughly rinse your body with warm water.  8. Do not shower/wash with your normal soap after using and rinsing off the CHG soap.  9. Pat yourself dry with a clean towel.  10. Wear clean pajamas to bed the night before surgery.  12. Place clean sheets on your bed the night of your first shower and do not sleep with pets.  13. Shower again with the CHG soap on the day of surgery prior  to arriving at the hospital.  14. Do not apply any deodorants/lotions/powders.  15. Please wear clean clothes to the hospital.

## 2023-08-04 NOTE — H&P (Signed)
Subjective:    CC: choledocolithiasis   HPI: Subjective Rhonda Gillespie is a 38 y.o. female who is consulted by [facility] for evaluation of above cc.  Symptoms were first noted 1 day ago. Pain is sharp, RUQ, radiating to back.  Associated with nausea/vomiting, exacerbated by nothing specific.   Also complains of constipation. Now pain is better.     Past Medical History:  has a past medical history of Migraine headache.   Past Surgical History:  has a past surgical history that includes Cesarean section (2011,2012,2014) and Tubal ligation (2014).   Family History: family history includes Cancer in her paternal grandfather; Depression in her father, maternal grandfather, and mother; Diabetes in her father and maternal grandmother; Heart disease in her maternal grandmother.   Social History:  reports that she has never smoked. She has never used smokeless tobacco. She reports that she does not drink alcohol and does not use drugs.   Current Medications:         Prior to Admission medications   Medication Sig Start Date End Date Taking? Authorizing Provider  ketoconazole (NIZORAL) 2 % shampoo APPLY 1 APPLICATION TOPICALLY AS DIRECTED. 3-4 TIMES PER WEEK 04/21/21     Willeen Niece, MD  spironolactone (ALDACTONE) 100 MG tablet TAKE 1 TABLET BY MOUTH EVERY DAY 05/12/21     Willeen Niece, MD      Allergies:       Allergies as of 07/23/2023 - Review Complete 07/23/2023  Allergen Reaction Noted   Amoxicillin Hives 06/19/2015      ROS:  General: Denies weight loss, weight gain, fatigue, fevers, chills, and night sweats. Eyes: Denies blurry vision, double vision, eye pain, itchy eyes, and tearing. Ears: Denies hearing loss, earache, and ringing in ears. Nose: Denies sinus pain, congestion, infections, runny nose, and nosebleeds. Mouth/throat: Denies hoarseness, sore throat, bleeding gums, and difficulty swallowing. Heart: Denies chest pain, palpitations, racing heart, irregular heartbeat,  leg pain or swelling, and decreased activity tolerance. Respiratory: Denies breathing difficulty, shortness of breath, wheezing, cough, and sputum. GI: Denies change in appetite, heartburn, diarrhea, and blood in stool. GU: Denies difficulty urinating, pain with urinating, urgency, frequency, blood in urine. Musculoskeletal: Denies joint stiffness, pain, swelling, muscle weakness. Skin: Denies rash, itching, mass, tumors, sores, and boils Neurologic: Denies headache, fainting, dizziness, seizures, numbness, and tingling. Psychiatric: Denies depression, anxiety, difficulty sleeping, and memory loss. Endocrine: Denies heat or cold intolerance, and increased thirst or urination. Blood/lymph: Denies easy bruising, and swollen glands       Objective:    Objective BP 130/88 (BP Location: Left Arm)   Pulse 70   Temp 98 F (36.7 C) (Oral)   Resp 18   Ht 5\' 3"  (1.6 m)   Wt 90.7 kg   SpO2 99%   BMI 35.43 kg/m      Constitutional :  alert, cooperative, appears stated age, and no distress  Lymphatics/Throat:  no asymmetry, masses, or scars  Respiratory:  clear to auscultation bilaterally  Cardiovascular:  regular rate and rhythm  Gastrointestinal: soft, non-tender; bowel sounds normal; no masses,  no organomegaly.   Musculoskeletal: Steady movement  Skin: Cool and moist  Psychiatric: Normal affect, non-agitated, not confused         LABS:      Latest Ref Rng & Units 07/23/2023    9:50 AM 08/02/2018    5:27 PM  CMP  Glucose 70 - 99 mg/dL 93  295   BUN 6 - 20 mg/dL 11  9  Creatinine 0.44 - 1.00 mg/dL 8.65  7.84   Sodium 696 - 145 mmol/L 137  137   Potassium 3.5 - 5.1 mmol/L 3.9  3.7   Chloride 98 - 111 mmol/L 106  101   CO2 22 - 32 mmol/L 24  29   Calcium 8.9 - 10.3 mg/dL 8.8  8.9   Total Protein 6.5 - 8.1 g/dL 7.0  7.7   Total Bilirubin 0.0 - 1.2 mg/dL 2.8  0.8   Alkaline Phos 38 - 126 U/L 102  76   AST 15 - 41 U/L 632  19   ALT 0 - 44 U/L 544  21         Latest Ref Rng &  Units 07/23/2023    9:50 AM 08/02/2018    5:27 PM 10/11/2012    5:21 AM  CBC  WBC 4.0 - 10.5 K/uL 5.2  10.8     Hemoglobin 12.0 - 15.0 g/dL 29.5  28.4     Hematocrit 36.0 - 46.0 % 42.6  43.4  31.9   Platelets 150 - 400 K/uL 258  309           RADS: CLINICAL DATA:  Right upper quadrant abdominal pain. Cholelithiasis.   EXAM: MRI ABDOMEN WITHOUT CONTRAST  (INCLUDING MRCP)   TECHNIQUE: Multiplanar multisequence MR imaging of the abdomen was performed. Heavily T2-weighted images of the biliary and pancreatic ducts were obtained, and three-dimensional MRCP images were rendered by post processing.   COMPARISON:  Right upper quadrant abdominal sonogram for today. 08/24/2018 CT abdomen/pelvis.   FINDINGS: Lower chest: No acute abnormality at the lung bases.   Hepatobiliary: Normal liver size and configuration. No hepatic steatosis. No liver mass. Layering small gallstones and small amount of layering sludge in the nondistended gallbladder, largest gallstone 4 mm. No gallbladder wall thickening or pericholecystic fluid. Mild central intrahepatic biliary ductal dilatation and mildly dilated common bile duct with diameter 8 mm. Two small indistinct filling defects in the lower third of the common bile duct on the 3D MRCP sequence measuring 4 mm (series 14/image 45) and 3 mm (series 14/image 47), which may represent small stones.   Pancreas: No pancreatic mass or duct dilation.  No pancreas divisum.   Spleen: Normal size. No mass.   Adrenals/Urinary Tract: Normal adrenals. No hydronephrosis. Normal kidneys with no renal mass.   Stomach/Bowel: Normal non-distended stomach. Visualized small and large bowel is normal caliber, with no bowel wall thickening. Moderate left colonic diverticulosis.   Vascular/Lymphatic: Normal caliber abdominal aorta. No pathologically enlarged lymph nodes in the abdomen.   Other: No abdominal ascites or focal fluid collection.   Musculoskeletal: No  aggressive appearing focal osseous lesions.   IMPRESSION: 1. Cholelithiasis. No MRI evidence of acute cholecystitis. 2. Mild central intrahepatic biliary ductal dilatation and mildly dilated common bile duct with CBD diameter 8 mm. Two small indistinct filling defects in the lower third of the common bile duct on the 3D MRCP sequence measuring 4 mm and 3 mm, which may represent small stones/choledocholithiasis. 3. Moderate left colonic diverticulosis.     Electronically Signed   By: Delbert Phenix M.D.   On: 07/23/2023 13:02   CLINICAL DATA:  Right upper quadrant abdominal pain.   EXAM: ULTRASOUND ABDOMEN LIMITED RIGHT UPPER QUADRANT   COMPARISON:  None Available.   FINDINGS: Gallbladder:   The gallbladder is physiologically distended. Small volume gallstones/sludge noted. No abnormal wall thickening or pericholecystic free fluid. Sonographic Eulah Pont sign was negative as per technologist.  Common bile duct:   Diameter: Up to 5.3 mm.  No intrahepatic bile duct dilation.   Liver:   No focal lesion identified. Within normal limits in parenchymal echogenicity. Portal vein is patent on color Doppler imaging with normal direction of blood flow towards the liver.   Other: None.   IMPRESSION: *Small volume cholelithiasis/sludge without imaging signs of acute cholecystitis.     Electronically Signed   By: Jules Schick M.D.   On: 07/23/2023 10:54 Assessment:    Assessment Choledocolithiasis- recommend robotic lap chole after ERCP to prevent future recurrence.   Plan:    Plan Discussed the risk of surgery including post-op infxn, seroma, biloma, chronic pain, poor-delayed wound healing, retained gallstone, conversion to open procedure, post-op SBO or ileus, and need for additional procedures to address said risks.  The risks of general anesthetic including MI, CVA, sudden death or even reaction to anesthetic medications also discussed. Alternatives include continued  observation.  Benefits include possible symptom relief, prevention of complications including acute cholecystitis, pancreatitis.   Typical post operative recovery of 3-5 days rest, continued pain in area and incision sites, possible loose stools up to 4-6 weeks, also discussed.   The patient understands the risks, any and all questions were answered to the patient's satisfaction.

## 2023-08-04 NOTE — H&P (View-Only) (Signed)
Subjective:    CC: choledocolithiasis   HPI: Subjective Rhonda Gillespie is a 38 y.o. female who is consulted by [facility] for evaluation of above cc.  Symptoms were first noted 1 day ago. Pain is sharp, RUQ, radiating to back.  Associated with nausea/vomiting, exacerbated by nothing specific.   Also complains of constipation. Now pain is better.     Past Medical History:  has a past medical history of Migraine headache.   Past Surgical History:  has a past surgical history that includes Cesarean section (2011,2012,2014) and Tubal ligation (2014).   Family History: family history includes Cancer in her paternal grandfather; Depression in her father, maternal grandfather, and mother; Diabetes in her father and maternal grandmother; Heart disease in her maternal grandmother.   Social History:  reports that she has never smoked. She has never used smokeless tobacco. She reports that she does not drink alcohol and does not use drugs.   Current Medications:         Prior to Admission medications   Medication Sig Start Date End Date Taking? Authorizing Provider  ketoconazole (NIZORAL) 2 % shampoo APPLY 1 APPLICATION TOPICALLY AS DIRECTED. 3-4 TIMES PER WEEK 04/21/21     Willeen Niece, MD  spironolactone (ALDACTONE) 100 MG tablet TAKE 1 TABLET BY MOUTH EVERY DAY 05/12/21     Willeen Niece, MD      Allergies:       Allergies as of 07/23/2023 - Review Complete 07/23/2023  Allergen Reaction Noted   Amoxicillin Hives 06/19/2015      ROS:  General: Denies weight loss, weight gain, fatigue, fevers, chills, and night sweats. Eyes: Denies blurry vision, double vision, eye pain, itchy eyes, and tearing. Ears: Denies hearing loss, earache, and ringing in ears. Nose: Denies sinus pain, congestion, infections, runny nose, and nosebleeds. Mouth/throat: Denies hoarseness, sore throat, bleeding gums, and difficulty swallowing. Heart: Denies chest pain, palpitations, racing heart, irregular heartbeat,  leg pain or swelling, and decreased activity tolerance. Respiratory: Denies breathing difficulty, shortness of breath, wheezing, cough, and sputum. GI: Denies change in appetite, heartburn, diarrhea, and blood in stool. GU: Denies difficulty urinating, pain with urinating, urgency, frequency, blood in urine. Musculoskeletal: Denies joint stiffness, pain, swelling, muscle weakness. Skin: Denies rash, itching, mass, tumors, sores, and boils Neurologic: Denies headache, fainting, dizziness, seizures, numbness, and tingling. Psychiatric: Denies depression, anxiety, difficulty sleeping, and memory loss. Endocrine: Denies heat or cold intolerance, and increased thirst or urination. Blood/lymph: Denies easy bruising, and swollen glands       Objective:    Objective BP 130/88 (BP Location: Left Arm)   Pulse 70   Temp 98 F (36.7 C) (Oral)   Resp 18   Ht 5\' 3"  (1.6 m)   Wt 90.7 kg   SpO2 99%   BMI 35.43 kg/m      Constitutional :  alert, cooperative, appears stated age, and no distress  Lymphatics/Throat:  no asymmetry, masses, or scars  Respiratory:  clear to auscultation bilaterally  Cardiovascular:  regular rate and rhythm  Gastrointestinal: soft, non-tender; bowel sounds normal; no masses,  no organomegaly.   Musculoskeletal: Steady movement  Skin: Cool and moist  Psychiatric: Normal affect, non-agitated, not confused         LABS:      Latest Ref Rng & Units 07/23/2023    9:50 AM 08/02/2018    5:27 PM  CMP  Glucose 70 - 99 mg/dL 93  161   BUN 6 - 20 mg/dL 11  9  Creatinine 0.44 - 1.00 mg/dL 1.61  0.96   Sodium 045 - 145 mmol/L 137  137   Potassium 3.5 - 5.1 mmol/L 3.9  3.7   Chloride 98 - 111 mmol/L 106  101   CO2 22 - 32 mmol/L 24  29   Calcium 8.9 - 10.3 mg/dL 8.8  8.9   Total Protein 6.5 - 8.1 g/dL 7.0  7.7   Total Bilirubin 0.0 - 1.2 mg/dL 2.8  0.8   Alkaline Phos 38 - 126 U/L 102  76   AST 15 - 41 U/L 632  19   ALT 0 - 44 U/L 544  21         Latest Ref Rng &  Units 07/23/2023    9:50 AM 08/02/2018    5:27 PM 10/11/2012    5:21 AM  CBC  WBC 4.0 - 10.5 K/uL 5.2  10.8     Hemoglobin 12.0 - 15.0 g/dL 40.9  81.1     Hematocrit 36.0 - 46.0 % 42.6  43.4  31.9   Platelets 150 - 400 K/uL 258  309           RADS: CLINICAL DATA:  Right upper quadrant abdominal pain. Cholelithiasis.   EXAM: MRI ABDOMEN WITHOUT CONTRAST  (INCLUDING MRCP)   TECHNIQUE: Multiplanar multisequence MR imaging of the abdomen was performed. Heavily T2-weighted images of the biliary and pancreatic ducts were obtained, and three-dimensional MRCP images were rendered by post processing.   COMPARISON:  Right upper quadrant abdominal sonogram for today. 08/24/2018 CT abdomen/pelvis.   FINDINGS: Lower chest: No acute abnormality at the lung bases.   Hepatobiliary: Normal liver size and configuration. No hepatic steatosis. No liver mass. Layering small gallstones and small amount of layering sludge in the nondistended gallbladder, largest gallstone 4 mm. No gallbladder wall thickening or pericholecystic fluid. Mild central intrahepatic biliary ductal dilatation and mildly dilated common bile duct with diameter 8 mm. Two small indistinct filling defects in the lower third of the common bile duct on the 3D MRCP sequence measuring 4 mm (series 14/image 45) and 3 mm (series 14/image 47), which may represent small stones.   Pancreas: No pancreatic mass or duct dilation.  No pancreas divisum.   Spleen: Normal size. No mass.   Adrenals/Urinary Tract: Normal adrenals. No hydronephrosis. Normal kidneys with no renal mass.   Stomach/Bowel: Normal non-distended stomach. Visualized small and large bowel is normal caliber, with no bowel wall thickening. Moderate left colonic diverticulosis.   Vascular/Lymphatic: Normal caliber abdominal aorta. No pathologically enlarged lymph nodes in the abdomen.   Other: No abdominal ascites or focal fluid collection.   Musculoskeletal: No  aggressive appearing focal osseous lesions.   IMPRESSION: 1. Cholelithiasis. No MRI evidence of acute cholecystitis. 2. Mild central intrahepatic biliary ductal dilatation and mildly dilated common bile duct with CBD diameter 8 mm. Two small indistinct filling defects in the lower third of the common bile duct on the 3D MRCP sequence measuring 4 mm and 3 mm, which may represent small stones/choledocholithiasis. 3. Moderate left colonic diverticulosis.     Electronically Signed   By: Delbert Phenix M.D.   On: 07/23/2023 13:02   CLINICAL DATA:  Right upper quadrant abdominal pain.   EXAM: ULTRASOUND ABDOMEN LIMITED RIGHT UPPER QUADRANT   COMPARISON:  None Available.   FINDINGS: Gallbladder:   The gallbladder is physiologically distended. Small volume gallstones/sludge noted. No abnormal wall thickening or pericholecystic free fluid. Sonographic Eulah Pont sign was negative as per technologist.  Common bile duct:   Diameter: Up to 5.3 mm.  No intrahepatic bile duct dilation.   Liver:   No focal lesion identified. Within normal limits in parenchymal echogenicity. Portal vein is patent on color Doppler imaging with normal direction of blood flow towards the liver.   Other: None.   IMPRESSION: *Small volume cholelithiasis/sludge without imaging signs of acute cholecystitis.     Electronically Signed   By: Jules Schick M.D.   On: 07/23/2023 10:54 Assessment:    Assessment Choledocolithiasis- recommend robotic lap chole after ERCP to prevent future recurrence.   Plan:    Plan Discussed the risk of surgery including post-op infxn, seroma, biloma, chronic pain, poor-delayed wound healing, retained gallstone, conversion to open procedure, post-op SBO or ileus, and need for additional procedures to address said risks.  The risks of general anesthetic including MI, CVA, sudden death or even reaction to anesthetic medications also discussed. Alternatives include continued  observation.  Benefits include possible symptom relief, prevention of complications including acute cholecystitis, pancreatitis.   Typical post operative recovery of 3-5 days rest, continued pain in area and incision sites, possible loose stools up to 4-6 weeks, also discussed.   The patient understands the risks, any and all questions were answered to the patient's satisfaction.

## 2023-08-11 MED ORDER — ORAL CARE MOUTH RINSE: 15.0000 mL | Freq: Once | OROMUCOSAL | Status: AC

## 2023-08-11 MED ORDER — LACTATED RINGERS IV SOLN
INTRAVENOUS | Status: DC
Start: 2023-08-11 — End: 2023-08-12

## 2023-08-11 MED ORDER — CEFAZOLIN SODIUM-DEXTROSE 2-4 GM/100ML-% IV SOLN
2.0000 g | INTRAVENOUS | Status: AC
  Administered 2023-08-12: 2 g via INTRAVENOUS

## 2023-08-11 MED ORDER — INDOCYANINE GREEN 25 MG IV SOLR
1.2500 mg | Freq: Once | INTRAVENOUS | Status: AC
  Administered 2023-08-12: 1.25 mg via INTRAVENOUS
  Filled 2023-08-11: qty 0.5

## 2023-08-11 MED ORDER — CHLORHEXIDINE GLUCONATE CLOTH 2 % EX PADS
6.0000 | MEDICATED_PAD | Freq: Once | CUTANEOUS | Status: AC
  Administered 2023-08-12: 6 via TOPICAL

## 2023-08-11 MED ORDER — CHLORHEXIDINE GLUCONATE 0.12 % MT SOLN
15.0000 mL | Freq: Once | OROMUCOSAL | Status: AC
  Administered 2023-08-12: 15 mL via OROMUCOSAL

## 2023-08-12 ENCOUNTER — Ambulatory Visit: Payer: BC Managed Care – PPO | Admitting: Certified Registered"

## 2023-08-12 ENCOUNTER — Ambulatory Visit: Payer: BC Managed Care – PPO | Admitting: Urgent Care

## 2023-08-12 ENCOUNTER — Encounter
Admission: RE | Disposition: A | Discharge status: Home / Self Care | Payer: Self-pay | Source: Home / Self Care | Attending: Surgery

## 2023-08-12 ENCOUNTER — Ambulatory Visit
Admission: RE | Admit: 2023-08-12 | Discharge: 2023-08-12 | Disposition: A | Discharge status: Home / Self Care | Payer: BC Managed Care – PPO | Attending: Surgery | Admitting: Surgery

## 2023-08-12 ENCOUNTER — Other Ambulatory Visit: Payer: Self-pay

## 2023-08-12 ENCOUNTER — Encounter: Payer: Self-pay | Admitting: Surgery

## 2023-08-12 DIAGNOSIS — K5730 Diverticulosis of large intestine without perforation or abscess without bleeding: Secondary | ICD-10-CM | POA: Diagnosis not present

## 2023-08-12 DIAGNOSIS — K811 Chronic cholecystitis: Secondary | ICD-10-CM

## 2023-08-12 DIAGNOSIS — K660 Peritoneal adhesions (postprocedural) (postinfection): Secondary | ICD-10-CM | POA: Diagnosis not present

## 2023-08-12 DIAGNOSIS — K8044 Calculus of bile duct with chronic cholecystitis without obstruction: Secondary | ICD-10-CM | POA: Insufficient documentation

## 2023-08-12 DIAGNOSIS — K8060 Calculus of gallbladder and bile duct with cholecystitis, unspecified, without obstruction: Secondary | ICD-10-CM | POA: Diagnosis present

## 2023-08-12 DIAGNOSIS — Z01812 Encounter for preprocedural laboratory examination: Secondary | ICD-10-CM

## 2023-08-12 DIAGNOSIS — K429 Umbilical hernia without obstruction or gangrene: Secondary | ICD-10-CM | POA: Insufficient documentation

## 2023-08-12 SURGERY — CHOLECYSTECTOMY, ROBOT-ASSISTED, LAPAROSCOPIC
Anesthesia: General | Site: Abdomen

## 2023-08-12 MED ORDER — ROCURONIUM BROMIDE 10 MG/ML (PF) SYRINGE
PREFILLED_SYRINGE | INTRAVENOUS | Status: AC
  Filled 2023-08-12: qty 10

## 2023-08-12 MED ORDER — OXYCODONE HCL 5 MG PO TABS
5.0000 mg | ORAL_TABLET | Freq: Once | ORAL | Status: AC | PRN
  Administered 2023-08-12: 5 mg via ORAL

## 2023-08-12 MED ORDER — DIPHENHYDRAMINE HCL 50 MG/ML IJ SOLN
INTRAMUSCULAR | Status: DC | PRN
  Administered 2023-08-12: 25 mg via INTRAVENOUS

## 2023-08-12 MED ORDER — DEXAMETHASONE SODIUM PHOSPHATE 10 MG/ML IJ SOLN
INTRAMUSCULAR | Status: AC
  Filled 2023-08-12: qty 1

## 2023-08-12 MED ORDER — OXYCODONE HCL 5 MG PO TABS
ORAL_TABLET | ORAL | Status: AC
  Filled 2023-08-12: qty 1

## 2023-08-12 MED ORDER — FENTANYL CITRATE (PF) 100 MCG/2ML IJ SOLN
INTRAMUSCULAR | Status: AC
  Filled 2023-08-12: qty 2

## 2023-08-12 MED ORDER — ONDANSETRON HCL 4 MG/2ML IJ SOLN
INTRAMUSCULAR | Status: AC
  Filled 2023-08-12: qty 2

## 2023-08-12 MED ORDER — LIDOCAINE-EPINEPHRINE (PF) 1 %-1:200000 IJ SOLN
INTRAMUSCULAR | Status: AC
  Filled 2023-08-12: qty 30

## 2023-08-12 MED ORDER — HYDROCODONE-ACETAMINOPHEN 5-325 MG PO TABS: 1.0000 | ORAL_TABLET | Freq: Four times a day (QID) | ORAL | 0 refills | Status: AC | PRN

## 2023-08-12 MED ORDER — FENTANYL CITRATE (PF) 100 MCG/2ML IJ SOLN
INTRAMUSCULAR | Status: DC | PRN
  Administered 2023-08-12: 100 ug via INTRAVENOUS

## 2023-08-12 MED ORDER — MIDAZOLAM HCL 2 MG/2ML IJ SOLN
INTRAMUSCULAR | Status: DC | PRN
  Administered 2023-08-12: 2 mg via INTRAVENOUS

## 2023-08-12 MED ORDER — DEXMEDETOMIDINE HCL IN NACL 80 MCG/20ML IV SOLN
INTRAVENOUS | Status: DC | PRN
  Administered 2023-08-12: 12 ug via INTRAVENOUS
  Administered 2023-08-12: 8 ug via INTRAVENOUS

## 2023-08-12 MED ORDER — BUPIVACAINE HCL (PF) 0.5 % IJ SOLN
INTRAMUSCULAR | Status: AC
  Filled 2023-08-12: qty 30

## 2023-08-12 MED ORDER — PROPOFOL 10 MG/ML IV BOLUS
INTRAVENOUS | Status: AC
  Filled 2023-08-12: qty 20

## 2023-08-12 MED ORDER — DROPERIDOL 2.5 MG/ML IJ SOLN: 0.6250 mg | Freq: Once | INTRAMUSCULAR | Status: DC | PRN

## 2023-08-12 MED ORDER — HYDROMORPHONE HCL 1 MG/ML IJ SOLN
INTRAMUSCULAR | Status: AC
  Filled 2023-08-12: qty 1

## 2023-08-12 MED ORDER — HYDROMORPHONE HCL 1 MG/ML IJ SOLN
0.2500 mg | INTRAMUSCULAR | Status: DC | PRN
  Administered 2023-08-12: 0.25 mg via INTRAVENOUS

## 2023-08-12 MED ORDER — IBUPROFEN 800 MG PO TABS: 800.0000 mg | ORAL_TABLET | Freq: Three times a day (TID) | ORAL | 0 refills | Status: AC | PRN

## 2023-08-12 MED ORDER — PROPOFOL 10 MG/ML IV BOLUS
INTRAVENOUS | Status: DC | PRN
  Administered 2023-08-12: 200 mg via INTRAVENOUS

## 2023-08-12 MED ORDER — CEFAZOLIN SODIUM-DEXTROSE 2-4 GM/100ML-% IV SOLN
INTRAVENOUS | Status: AC
  Filled 2023-08-12: qty 100

## 2023-08-12 MED ORDER — ACETAMINOPHEN 325 MG PO TABS: 650.0000 mg | ORAL_TABLET | Freq: Three times a day (TID) | ORAL | 0 refills | Status: AC | PRN

## 2023-08-12 MED ORDER — LIDOCAINE HCL (PF) 2 % IJ SOLN
INTRAMUSCULAR | Status: AC
  Filled 2023-08-12: qty 5

## 2023-08-12 MED ORDER — LIDOCAINE HCL (CARDIAC) PF 100 MG/5ML IV SOSY
PREFILLED_SYRINGE | INTRAVENOUS | Status: DC | PRN
  Administered 2023-08-12: 100 mg via INTRAVENOUS

## 2023-08-12 MED ORDER — OXYCODONE HCL 5 MG/5ML PO SOLN: 5.0000 mg | Freq: Once | ORAL | Status: AC | PRN

## 2023-08-12 MED ORDER — ACETAMINOPHEN 10 MG/ML IV SOLN
INTRAVENOUS | Status: DC | PRN
  Administered 2023-08-12: 1000 mg via INTRAVENOUS

## 2023-08-12 MED ORDER — DEXAMETHASONE SODIUM PHOSPHATE 10 MG/ML IJ SOLN
INTRAMUSCULAR | Status: DC | PRN
  Administered 2023-08-12: 10 mg via INTRAVENOUS

## 2023-08-12 MED ORDER — ACETAMINOPHEN 10 MG/ML IV SOLN
INTRAVENOUS | Status: AC
  Filled 2023-08-12: qty 100

## 2023-08-12 MED ORDER — KETOROLAC TROMETHAMINE 30 MG/ML IJ SOLN
INTRAMUSCULAR | Status: DC | PRN
  Administered 2023-08-12: 30 mg via INTRAVENOUS

## 2023-08-12 MED ORDER — ROCURONIUM BROMIDE 100 MG/10ML IV SOLN
INTRAVENOUS | Status: DC | PRN
  Administered 2023-08-12: 50 mg via INTRAVENOUS

## 2023-08-12 MED ORDER — SUGAMMADEX SODIUM 200 MG/2ML IV SOLN
INTRAVENOUS | Status: DC | PRN
  Administered 2023-08-12: 200 mg via INTRAVENOUS

## 2023-08-12 MED ORDER — MIDAZOLAM HCL 2 MG/2ML IJ SOLN
INTRAMUSCULAR | Status: AC
  Filled 2023-08-12: qty 2

## 2023-08-12 MED ORDER — LIDOCAINE-EPINEPHRINE (PF) 1 %-1:200000 IJ SOLN
INTRAMUSCULAR | Status: DC | PRN
  Administered 2023-08-12: 20 mL via INTRAMUSCULAR

## 2023-08-12 MED ORDER — DOCUSATE SODIUM 100 MG PO CAPS: 100.0000 mg | ORAL_CAPSULE | Freq: Two times a day (BID) | ORAL | 0 refills | Status: AC | PRN

## 2023-08-12 MED ORDER — ONDANSETRON HCL 4 MG/2ML IJ SOLN
INTRAMUSCULAR | Status: DC | PRN
  Administered 2023-08-12: 4 mg via INTRAVENOUS

## 2023-08-12 MED ORDER — CHLORHEXIDINE GLUCONATE 0.12 % MT SOLN
OROMUCOSAL | Status: AC
  Filled 2023-08-12: qty 15

## 2023-08-12 SURGICAL SUPPLY — 44 items
ANCHOR TIS RET SYS 235ML (MISCELLANEOUS) ×2 IMPLANT
BAG PRESSURE INF REUSE 1000 (BAG) IMPLANT
CATH REDDICK CHOLANGI 4FR 50CM (CATHETERS) IMPLANT
CAUTERY HOOK MNPLR 1.6 DVNC XI (INSTRUMENTS) ×2 IMPLANT
CLIP LIGATING HEMO O LOK GREEN (MISCELLANEOUS) ×2 IMPLANT
DERMABOND ADVANCED .7 DNX12 (GAUZE/BANDAGES/DRESSINGS) ×2 IMPLANT
DRAPE ARM DVNC X/XI (DISPOSABLE) ×8 IMPLANT
DRAPE C-ARM XRAY 36X54 (DRAPES) IMPLANT
DRAPE COLUMN DVNC XI (DISPOSABLE) ×2 IMPLANT
ELECT REM PT RETURN 9FT ADLT (ELECTROSURGICAL) ×2
ELECTRODE REM PT RTRN 9FT ADLT (ELECTROSURGICAL) ×2 IMPLANT
FORCEPS BPLR FENES DVNC XI (FORCEP) ×2 IMPLANT
FORCEPS PROGRASP DVNC XI (FORCEP) ×2 IMPLANT
GLOVE BIOGEL PI IND STRL 7.0 (GLOVE) ×4 IMPLANT
GLOVE SURG SYN 6.5 ES PF (GLOVE) ×4
GLOVE SURG SYN 6.5 PF PI (GLOVE) ×4 IMPLANT
GOWN STRL REUS W/ TWL LRG LVL3 (GOWN DISPOSABLE) ×6 IMPLANT
GRASPER SUT TROCAR 14GX15 (MISCELLANEOUS) IMPLANT
IRRIGATOR SUCT 8 DISP DVNC XI (IRRIGATION / IRRIGATOR) IMPLANT
IV NS 1000ML BAXH (IV SOLUTION) IMPLANT
KIT TURNOVER KIT A (KITS) ×2 IMPLANT
LABEL OR SOLS (LABEL) ×2 IMPLANT
MANIFOLD NEPTUNE II (INSTRUMENTS) ×2 IMPLANT
NDL HYPO 22X1.5 SAFETY MO (MISCELLANEOUS) ×2 IMPLANT
NDL INSUFFLATION 14GA 120MM (NEEDLE) ×2 IMPLANT
NEEDLE HYPO 22X1.5 SAFETY MO (MISCELLANEOUS) ×2
NEEDLE INSUFFLATION 14GA 120MM (NEEDLE) ×2
NS IRRIG 500ML POUR BTL (IV SOLUTION) ×2 IMPLANT
OBTURATOR OPTICAL STND 8 DVNC (TROCAR) ×2
OBTURATOR OPTICALSTD 8 DVNC (TROCAR) ×2 IMPLANT
PACK LAP CHOLECYSTECTOMY (MISCELLANEOUS) ×2 IMPLANT
SCISSORS LAP 5X35 DISP (ENDOMECHANICALS) IMPLANT
SEAL UNIV 5-12 XI (MISCELLANEOUS) ×8 IMPLANT
SET TUBE SMOKE EVAC HIGH FLOW (TUBING) ×2 IMPLANT
SOL ELECTROSURG ANTI STICK (MISCELLANEOUS) ×2
SOLUTION ELECTROSURG ANTI STCK (MISCELLANEOUS) ×2 IMPLANT
SPIKE FLUID TRANSFER (MISCELLANEOUS) ×4 IMPLANT
SUT MNCRL 4-0 27 PS-2 XMFL (SUTURE) ×4
SUT VICRYL 0 UR6 27IN ABS (SUTURE) ×2 IMPLANT
SUTURE MNCRL 4-0 27XMF (SUTURE) ×4 IMPLANT
SYR 30ML LL (SYRINGE) IMPLANT
SYSTEM WECK SHIELD CLOSURE (TROCAR) IMPLANT
TRAP FLUID SMOKE EVACUATOR (MISCELLANEOUS) ×2 IMPLANT
WATER STERILE IRR 500ML POUR (IV SOLUTION) ×2 IMPLANT

## 2023-08-12 NOTE — Transfer of Care (Signed)
Immediate Anesthesia Transfer of Care Note  Patient: Rhonda Gillespie  Procedure(s) Performed: XI ROBOTIC ASSISTED LAPAROSCOPIC CHOLECYSTECTOMY (Abdomen) INDOCYANINE GREEN FLUORESCENCE IMAGING (ICG)  Patient Location: PACU  Anesthesia Type:General  Level of Consciousness: drowsy  Airway & Oxygen Therapy: Patient Spontanous Breathing and Patient connected to face mask oxygen  Post-op Assessment: Report given to RN and Post -op Vital signs reviewed and stable  Post vital signs: stable  Last Vitals:  Vitals Value Taken Time  BP 98/47 08/12/23 1038  Temp    Pulse 77 08/12/23 1043  Resp 19 08/12/23 1043  SpO2 100 % 08/12/23 1043  Vitals shown include unfiled device data.  Last Pain:  Vitals:   08/12/23 0810  TempSrc: Temporal  PainSc: 0-No pain         Complications: No notable events documented.

## 2023-08-12 NOTE — Interval H&P Note (Signed)
History and Physical Interval Note:  08/12/2023 8:59 AM  Rhonda Gillespie  has presented today for surgery, with the diagnosis of choledocholithiasis.  The various methods of treatment have been discussed with the patient and family. After consideration of risks, benefits and other options for treatment, the patient has consented to  Procedure(s): XI ROBOTIC ASSISTED LAPAROSCOPIC CHOLECYSTECTOMY (N/A) INDOCYANINE GREEN FLUORESCENCE IMAGING (ICG) (N/A) as a surgical intervention.  The patient's history has been reviewed, patient examined, no change in status, stable for surgery.  I have reviewed the patient's chart and labs.  Questions were answered to the patient's satisfaction.     Rhonda Gillespie

## 2023-08-12 NOTE — Discharge Instructions (Signed)
Laparoscopic Cholecystectomy, Care After This sheet gives you information about how to care for yourself after your procedure. Your doctor may also give you more specific instructions. If you have problems or questions, contact your doctor. Follow these instructions at home: Care for cuts from surgery (incisions)  Follow instructions from your doctor about how to take care of your cuts from surgery. Make sure you: Wash your hands with soap and water before you change your bandage (dressing). If you cannot use soap and water, use hand sanitizer. Change your bandage as told by your doctor. Leave stitches (sutures), skin glue, or skin tape (adhesive) strips in place. They may need to stay in place for 2 weeks or longer. If tape strips get loose and curl up, you may trim the loose edges. Do not remove tape strips completely unless your doctor says it is okay. Do not take baths, swim, or use a hot tub until your doctor says it is okay. OK TO SHOWER 24HRS AFTER YOUR SURGERY.  Check your surgical cut area every day for signs of infection. Check for: More redness, swelling, or pain. More fluid or blood. Warmth. Pus or a bad smell. Activity Do not drive or use heavy machinery while taking prescription pain medicine. Do not play contact sports until your doctor says it is okay. Do not drive for 24 hours if you were given a medicine to help you relax (sedative). Rest as needed. Do not return to work or school until your doctor says it is okay. General instructions  tylenol and advil as needed for discomfort.  Please alternate between the two every four hours as needed for pain.    Use narcotics, if prescribed, only when tylenol and motrin is not enough to control pain.  325-650mg  every 8hrs to max of 3000mg /24hrs (including the 325mg  in every norco dose) for the tylenol.    Advil up to 800mg  per dose every 8hrs as needed for pain.   To prevent or treat constipation while you are taking prescription  pain medicine, your doctor may recommend that you: Drink enough fluid to keep your pee (urine) clear or pale yellow. Take over-the-counter or prescription medicines. Eat foods that are high in fiber, such as fresh fruits and vegetables, whole grains, and beans. Limit foods that are high in fat and processed sugars, such as fried and sweet foods. Contact a doctor if: You develop a rash. You have more redness, swelling, or pain around your surgical cuts. You have more fluid or blood coming from your surgical cuts. Your surgical cuts feel warm to the touch. You have pus or a bad smell coming from your surgical cuts. You have a fever. One or more of your surgical cuts breaks open. You have trouble breathing. You have chest pain. You have pain that is getting worse in your shoulders. You faint or feel dizzy when you stand. You have very bad pain in your belly (abdomen). You are sick to your stomach (nauseous) for more than one day. You have throwing up (vomiting) that lasts for more than one day. You have leg pain. This information is not intended to replace advice given to you by your health care provider. Make sure you discuss any questions you have with your health care provider. Document Released: 04/07/2008 Document Revised: 01/18/2016 Document Reviewed: 12/16/2015 Elsevier Interactive Patient Education  2019 [employer].

## 2023-08-12 NOTE — Anesthesia Postprocedure Evaluation (Signed)
Anesthesia Post Note  Patient: Rhonda Gillespie  Procedure(s) Performed: XI ROBOTIC ASSISTED LAPAROSCOPIC CHOLECYSTECTOMY (Abdomen) INDOCYANINE GREEN FLUORESCENCE IMAGING (ICG)  Patient location during evaluation: PACU Anesthesia Type: General Level of consciousness: awake and alert Pain management: pain level controlled Vital Signs Assessment: post-procedure vital signs reviewed and stable Respiratory status: spontaneous breathing, nonlabored ventilation, respiratory function stable and patient connected to nasal cannula oxygen Cardiovascular status: blood pressure returned to baseline and stable Postop Assessment: no apparent nausea or vomiting Anesthetic complications: no   No notable events documented.   Last Vitals:  Vitals:   08/12/23 1124 08/12/23 1130  BP:  92/61  Pulse: 72 75  Resp: 11 17  Temp:  (!) 36.2 C  SpO2: 100% 100%    Last Pain:  Vitals:   08/12/23 1130  TempSrc:   PainSc: 3                  Louie Boston

## 2023-08-12 NOTE — Anesthesia Procedure Notes (Signed)
Procedure Name: Intubation Date/Time: 08/12/2023 9:24 AM  Performed by: Rodney Booze, CRNAPre-anesthesia Checklist: Patient identified, Emergency Drugs available, Suction available and Patient being monitored Patient Re-evaluated:Patient Re-evaluated prior to induction Oxygen Delivery Method: Circle system utilized Preoxygenation: Pre-oxygenation with 100% oxygen Induction Type: IV induction Ventilation: Mask ventilation without difficulty Laryngoscope Size: 2 and Miller Tube type: Oral Tube size: 7.0 mm Number of attempts: 1 Airway Equipment and Method: Stylet and Oral airway Placement Confirmation: ETT inserted through vocal cords under direct vision, positive ETCO2 and breath sounds checked- equal and bilateral Secured at: 21 cm Tube secured with: Tape Dental Injury: Teeth and Oropharynx as per pre-operative assessment

## 2023-08-12 NOTE — Op Note (Signed)
Preoperative diagnosis:  chronic and cholecystitis  Postoperative diagnosis: same as above  Procedure: Robotic assisted Laparoscopic Cholecystectomy, primary umbilical hernia repair  Anesthesia: GETA   Surgeon: Sung Amabile  Specimen: Gallbladder  Complications: None  EBL: 15mL  Wound Classification: Clean Contaminated  Indications: see HPI  Findings: Critical view of safety noted Cystic duct and artery identified, ligated and divided, clips remained intact at end of procedure Adequate hemostasis  Description of procedure:  The patient was placed on the operating table in the supine position. SCDs placed, pre-op abx administered.  General anesthesia was induced and OG tube placed by anesthesia. A time-out was completed verifying correct patient, procedure, site, positioning, and implant(s) and/or special equipment prior to beginning this procedure. The abdomen was prepped and draped in the usual sterile fashion.    Veress needle was placed at the Palmer's point and insufflation was started after confirming a positive saline drop test and no immediate increase in abdominal pressure.  After reaching 15 mm, the Veress needle was removed and a 8 mm port was placed via optiview technique left side abdomen.  The abdomen was inspected and no abnormalities or injuries were found. thin adhesions of omentum to umbilical hernia defect cut with scissors. Under direct vision, ports were placed in the following locations: One 12 mm patient left of the umbilicus, 8cm from the umbilical port, one 8 mm port placed to the patient right of the umbilical port 8 cm apart.  1 additional 8 mm port placed through umbilicus, directly through visible hernia.  Once ports were placed, The table was placed in the reverse Trendelenburg position with the right side up. The Xi platform was brought into the operative field and docked to the ports successfully.  An endoscope was placed through the umbilical port,  fenestrated grasper through the adjacent patient right port, prograsp to the far patient left port, and then a hook cautery in the left port.  The dome of the gallbladder was grasped with prograsp, passed and retracted over the dome of the liver. Adhesions between the gallbladder and omentum, duodenum and transverse colon were lysed via hook cautery. The infundibulum was grasped with the fenestrated grasper and retracted toward the right lower quadrant. This maneuver exposed Calot's triangle. The peritoneum overlying the gallbladder infundibulum was then dissected  and the cystic duct and cystic artery identified.  Critical view of safety with the liver bed clearly visible behind the duct and artery with no additional structures noted.  The cystic duct and cystic artery clipped and divided close to the gallbladder.     The gallbladder was then dissected from its peritoneal and liver bed attachments by electrocautery. Hemostasis was checked prior to removing the hook cautery and the Endo Catch bag was then placed through the 12 mm port and the gallbladder was removed.  The gallbladder was passed off the table as a specimen. There was no evidence of bleeding from the gallbladder fossa or cystic artery or leakage of the bile from the cystic duct stump. The 12 mm port site  and umbilical hernia site closed with PMI using 0 vicryl under direct vision.  Abdomen desufflated and secondary trocars were removed under direct vision. No bleeding was noted. All skin incisions then closed with subcuticular sutures of 4-0 monocryl and dressed with topical skin adhesive. The orogastric tube was removed and patient extubated.  The patient tolerated the procedure well and was taken to the postanesthesia care unit in stable condition.  All sponge and instrument count  correct at end of procedure.

## 2023-08-12 NOTE — Anesthesia Preprocedure Evaluation (Signed)
Anesthesia Evaluation  Patient identified by MRN, date of birth, ID band Patient awake    Reviewed: Allergy & Precautions, NPO status , Patient's Chart, lab work & pertinent test results  History of Anesthesia Complications (+) PONV and history of anesthetic complications  Airway Mallampati: II  TM Distance: >3 FB Neck ROM: full    Dental no notable dental hx.    Pulmonary neg pulmonary ROS   Pulmonary exam normal        Cardiovascular negative cardio ROS Normal cardiovascular exam     Neuro/Psych  Headaches  negative psych ROS   GI/Hepatic negative GI ROS, Neg liver ROS,,,  Endo/Other  negative endocrine ROS    Renal/GU      Musculoskeletal   Abdominal   Peds  Hematology negative hematology ROS (+)   Anesthesia Other Findings Past Medical History: No date: Choledocholithiasis No date: Migraine headache No date: PONV (postoperative nausea and vomiting)  Past Surgical History: 2011,2012,2014: CESAREAN SECTION     Comment:  x3 07/28/2023: ENDOSCOPIC RETROGRADE CHOLANGIOPANCREATOGRAPHY (ERCP) WITH  PROPOFOL; N/A     Comment:  Procedure: ENDOSCOPIC RETROGRADE               CHOLANGIOPANCREATOGRAPHY (ERCP) WITH PROPOFOL;  Surgeon:               Midge Minium, MD;  Location: [facility] ENDOSCOPY;  Service:               Endoscopy;  Laterality: N/A; 07/28/2023: REMOVAL OF STONES     Comment:  Procedure: REMOVAL OF STONES;  Surgeon: Midge Minium,               MD;  Location: [facility] ENDOSCOPY;  Service: Endoscopy;; 07/28/2023: SPHINCTEROTOMY     Comment:  Procedure: SPHINCTEROTOMY;  Surgeon: Midge Minium, MD;                Location: [facility] ENDOSCOPY;  Service: Endoscopy;; 2014: TUBAL LIGATION     Reproductive/Obstetrics negative OB ROS                             Anesthesia Physical Anesthesia Plan  ASA: 2  Anesthesia Plan: General ETT   Post-op Pain Management: Toradol IV (intra-op)*,  Ofirmev IV (intra-op)*, Dilaudid IV and Ketamine IV*   Induction: Intravenous  PONV Risk Score and Plan: 4 or greater and Ondansetron, Dexamethasone, Midazolam and Treatment may vary due to age or medical condition  Airway Management Planned: Oral ETT  Additional Equipment:   Intra-op Plan:   Post-operative Plan: Extubation in OR  Informed Consent: I have reviewed the patients History and Physical, chart, labs and discussed the procedure including the risks, benefits and alternatives for the proposed anesthesia with the patient or authorized representative who has indicated his/her understanding and acceptance.     Dental Advisory Given  Plan Discussed with: Anesthesiologist, CRNA and Surgeon  Anesthesia Plan Comments: (Patient consented for risks of anesthesia including but not limited to:  - adverse reactions to medications - damage to eyes, teeth, lips or other oral mucosa - nerve damage due to positioning  - sore throat or hoarseness - Damage to heart, brain, nerves, lungs, other parts of body or loss of life  Patient voiced understanding and assent.)        Anesthesia Quick Evaluation

## 2023-08-15 LAB — SURGICAL PATHOLOGY

## 2024-03-16 ENCOUNTER — Institutional Professional Consult (permissible substitution): Admitting: Plastic Surgery
